# Patient Record
Sex: Female | Born: 1935 | Race: White | Hispanic: No | Marital: Married | Smoking: Never smoker
Health system: Southern US, Community
[De-identification: ages and names within clinical notes are randomized; demographics above are authoritative.]

## PROBLEM LIST (undated history)

## (undated) DIAGNOSIS — I4891 Unspecified atrial fibrillation: Secondary | ICD-10-CM

## (undated) DIAGNOSIS — I509 Heart failure, unspecified: Secondary | ICD-10-CM

## (undated) DIAGNOSIS — I10 Essential (primary) hypertension: Secondary | ICD-10-CM

## (undated) DIAGNOSIS — E785 Hyperlipidemia, unspecified: Secondary | ICD-10-CM

## (undated) HISTORY — PX: KNEE SURGERY: SHX244

## (undated) HISTORY — PX: PACEMAKER INSERTION: SHX728

## (undated) HISTORY — PX: THYROIDECTOMY: SHX17

---

## 2003-11-27 ENCOUNTER — Other Ambulatory Visit: Payer: Self-pay

## 2006-03-06 ENCOUNTER — Ambulatory Visit: Payer: Self-pay | Admitting: Urology

## 2006-09-12 ENCOUNTER — Other Ambulatory Visit: Payer: Self-pay

## 2006-09-19 ENCOUNTER — Inpatient Hospital Stay: Payer: Self-pay | Admitting: Unknown Physician Specialty

## 2006-09-23 ENCOUNTER — Encounter: Payer: Self-pay | Admitting: Internal Medicine

## 2006-10-07 ENCOUNTER — Encounter: Payer: Self-pay | Admitting: Internal Medicine

## 2006-11-07 ENCOUNTER — Encounter: Payer: Self-pay | Admitting: Internal Medicine

## 2007-09-10 ENCOUNTER — Ambulatory Visit: Payer: Self-pay | Admitting: Unknown Physician Specialty

## 2008-03-29 ENCOUNTER — Emergency Department: Payer: Self-pay | Admitting: Emergency Medicine

## 2008-04-25 ENCOUNTER — Emergency Department: Payer: Self-pay | Admitting: Internal Medicine

## 2009-10-19 ENCOUNTER — Ambulatory Visit: Payer: Self-pay | Admitting: Ophthalmology

## 2009-10-19 ENCOUNTER — Ambulatory Visit: Payer: Self-pay | Admitting: Cardiovascular Disease

## 2009-10-26 ENCOUNTER — Ambulatory Visit: Payer: Self-pay | Admitting: Ophthalmology

## 2009-12-08 ENCOUNTER — Ambulatory Visit: Payer: Self-pay | Admitting: Ophthalmology

## 2012-07-27 ENCOUNTER — Emergency Department: Payer: Self-pay | Admitting: Emergency Medicine

## 2012-07-27 LAB — PROTIME-INR: INR: 2

## 2013-11-09 ENCOUNTER — Emergency Department: Payer: Self-pay | Admitting: Emergency Medicine

## 2013-11-09 LAB — COMPREHENSIVE METABOLIC PANEL
ALBUMIN: 3.3 g/dL — AB (ref 3.4–5.0)
ANION GAP: 3 — AB (ref 7–16)
Alkaline Phosphatase: 72 U/L
BILIRUBIN TOTAL: 0.8 mg/dL (ref 0.2–1.0)
BUN: 17 mg/dL (ref 7–18)
CO2: 28 mmol/L (ref 21–32)
Calcium, Total: 9.4 mg/dL (ref 8.5–10.1)
Chloride: 101 mmol/L (ref 98–107)
Creatinine: 0.89 mg/dL (ref 0.60–1.30)
EGFR (Non-African Amer.): 59 — ABNORMAL LOW
Glucose: 90 mg/dL (ref 65–99)
Osmolality: 266 (ref 275–301)
Potassium: 3.6 mmol/L (ref 3.5–5.1)
SGOT(AST): 42 U/L — ABNORMAL HIGH (ref 15–37)
SGPT (ALT): 25 U/L (ref 12–78)
SODIUM: 132 mmol/L — AB (ref 136–145)
Total Protein: 8.4 g/dL — ABNORMAL HIGH (ref 6.4–8.2)

## 2013-11-09 LAB — TROPONIN I
Troponin-I: <0.02 ng/mL
Troponin-I: 0.02 ng/mL

## 2013-11-09 LAB — PROTIME-INR
INR: 2
PROTHROMBIN TIME: 21.8 s — AB (ref 11.5–14.7)

## 2013-11-09 LAB — URINALYSIS, COMPLETE
Bacteria: NONE SEEN
Bilirubin,UR: NEGATIVE
GLUCOSE, UR: NEGATIVE mg/dL (ref 0–75)
Hyaline Cast: 21
KETONE: NEGATIVE
NITRITE: NEGATIVE
Ph: 6 (ref 4.5–8.0)
Protein: NEGATIVE
SPECIFIC GRAVITY: 1.005 (ref 1.003–1.030)
Squamous Epithelial: NONE SEEN

## 2013-11-09 LAB — CBC
HCT: 37.4 % (ref 35.0–47.0)
HGB: 12.5 g/dL (ref 12.0–16.0)
MCH: 30.7 pg (ref 26.0–34.0)
MCHC: 33.5 g/dL (ref 32.0–36.0)
MCV: 92 fL (ref 80–100)
Platelet: 120 10*3/uL — ABNORMAL LOW (ref 150–440)
RBC: 4.08 10*6/uL (ref 3.80–5.20)
RDW: 14.9 % — AB (ref 11.5–14.5)
WBC: 8.5 10*3/uL (ref 3.6–11.0)

## 2013-11-09 LAB — CK TOTAL AND CKMB (NOT AT ARMC)
CK, Total: 107 U/L (ref 21–215)
CK-MB: 2.5 ng/mL (ref 0.5–3.6)

## 2014-03-05 ENCOUNTER — Inpatient Hospital Stay: Payer: Self-pay | Admitting: Specialist

## 2014-03-05 LAB — COMPREHENSIVE METABOLIC PANEL
ANION GAP: 12 (ref 7–16)
AST: 56 U/L — AB (ref 15–37)
Albumin: 3.7 g/dL (ref 3.4–5.0)
Alkaline Phosphatase: 86 U/L
BUN: 20 mg/dL — AB (ref 7–18)
Bilirubin,Total: 0.6 mg/dL (ref 0.2–1.0)
CO2: 22 mmol/L (ref 21–32)
Calcium, Total: 9.4 mg/dL (ref 8.5–10.1)
Chloride: 101 mmol/L (ref 98–107)
Creatinine: 1.17 mg/dL (ref 0.60–1.30)
EGFR (Non-African Amer.): 42 — ABNORMAL LOW
GFR CALC AF AMER: 49 — AB
GLUCOSE: 102 mg/dL — AB (ref 65–99)
OSMOLALITY: 273 (ref 275–301)
Potassium: 3.4 mmol/L — ABNORMAL LOW (ref 3.5–5.1)
SGPT (ALT): 33 U/L (ref 12–78)
Sodium: 135 mmol/L — ABNORMAL LOW (ref 136–145)
Total Protein: 8.6 g/dL — ABNORMAL HIGH (ref 6.4–8.2)

## 2014-03-05 LAB — CBC WITH DIFFERENTIAL/PLATELET
BASOS ABS: 0 10*3/uL (ref 0.0–0.1)
BASOS PCT: 0.2 %
EOS ABS: 0.1 10*3/uL (ref 0.0–0.7)
Eosinophil %: 0.8 %
HCT: 39.1 % (ref 35.0–47.0)
HGB: 13 g/dL (ref 12.0–16.0)
Lymphocyte #: 0.4 10*3/uL — ABNORMAL LOW (ref 1.0–3.6)
Lymphocyte %: 5.8 %
MCH: 30.4 pg (ref 26.0–34.0)
MCHC: 33.3 g/dL (ref 32.0–36.0)
MCV: 92 fL (ref 80–100)
Monocyte #: 0.1 x10 3/mm — ABNORMAL LOW (ref 0.2–0.9)
Monocyte %: 1.5 %
NEUTROS PCT: 91.7 %
Neutrophil #: 6.3 10*3/uL (ref 1.4–6.5)
Platelet: 200 10*3/uL (ref 150–440)
RBC: 4.27 10*6/uL (ref 3.80–5.20)
RDW: 14.4 % (ref 11.5–14.5)
WBC: 6.9 10*3/uL (ref 3.6–11.0)

## 2014-03-05 LAB — URINALYSIS, COMPLETE
Bilirubin,UR: NEGATIVE
GLUCOSE, UR: NEGATIVE mg/dL (ref 0–75)
Ketone: NEGATIVE
NITRITE: POSITIVE
PH: 6 (ref 4.5–8.0)
Protein: 100
RBC,UR: 18 /HPF (ref 0–5)
SPECIFIC GRAVITY: 1.01 (ref 1.003–1.030)
Squamous Epithelial: <1
WBC UR: 38 /HPF (ref 0–5)

## 2014-03-05 LAB — PROTIME-INR
INR: 2.5
Prothrombin Time: 26.7 secs — ABNORMAL HIGH (ref 11.5–14.7)

## 2014-03-06 LAB — BASIC METABOLIC PANEL
ANION GAP: 6 — AB (ref 7–16)
BUN: 19 mg/dL — AB (ref 7–18)
CO2: 22 mmol/L (ref 21–32)
Calcium, Total: 8.8 mg/dL (ref 8.5–10.1)
Chloride: 113 mmol/L — ABNORMAL HIGH (ref 98–107)
Creatinine: 0.84 mg/dL (ref 0.60–1.30)
EGFR (African American): >60
EGFR (Non-African Amer.): >60
GLUCOSE: 94 mg/dL (ref 65–99)
Osmolality: 283 (ref 275–301)
POTASSIUM: 4 mmol/L (ref 3.5–5.1)
Sodium: 141 mmol/L (ref 136–145)

## 2014-03-06 LAB — CBC WITH DIFFERENTIAL/PLATELET
BASOS PCT: 0.8 %
Basophil #: 0.1 10*3/uL (ref 0.0–0.1)
EOS ABS: 0.4 10*3/uL (ref 0.0–0.7)
EOS PCT: 3.6 %
HCT: 31.5 % — ABNORMAL LOW (ref 35.0–47.0)
HGB: 10.5 g/dL — ABNORMAL LOW (ref 12.0–16.0)
LYMPHS PCT: 11.1 %
Lymphocyte #: 1.1 10*3/uL (ref 1.0–3.6)
MCH: 30.9 pg (ref 26.0–34.0)
MCHC: 33.4 g/dL (ref 32.0–36.0)
MCV: 93 fL (ref 80–100)
MONO ABS: 1.1 x10 3/mm — AB (ref 0.2–0.9)
Monocyte %: 11.1 %
NEUTROS ABS: 7.4 10*3/uL — AB (ref 1.4–6.5)
Neutrophil %: 73.4 %
Platelet: 127 10*3/uL — ABNORMAL LOW (ref 150–440)
RBC: 3.4 10*6/uL — ABNORMAL LOW (ref 3.80–5.20)
RDW: 14.8 % — AB (ref 11.5–14.5)
WBC: 10.1 10*3/uL (ref 3.6–11.0)

## 2014-03-06 LAB — PROTIME-INR
INR: 2.3
Prothrombin Time: 24.8 secs — ABNORMAL HIGH (ref 11.5–14.7)

## 2014-03-07 LAB — BASIC METABOLIC PANEL
Anion Gap: 8 (ref 7–16)
BUN: 14 mg/dL (ref 7–18)
CO2: 24 mmol/L (ref 21–32)
CREATININE: 0.76 mg/dL (ref 0.60–1.30)
Calcium, Total: 9.2 mg/dL (ref 8.5–10.1)
Chloride: 106 mmol/L (ref 98–107)
EGFR (Non-African Amer.): >60
Glucose: 99 mg/dL (ref 65–99)
Osmolality: 276 (ref 275–301)
POTASSIUM: 3.8 mmol/L (ref 3.5–5.1)
SODIUM: 138 mmol/L (ref 136–145)

## 2014-03-07 LAB — CBC WITH DIFFERENTIAL/PLATELET
BASOS PCT: 0.5 %
Basophil #: 0 10*3/uL (ref 0.0–0.1)
Eosinophil #: 0.4 10*3/uL (ref 0.0–0.7)
Eosinophil %: 4.3 %
HCT: 37.3 % (ref 35.0–47.0)
HGB: 12.4 g/dL (ref 12.0–16.0)
LYMPHS ABS: 1.3 10*3/uL (ref 1.0–3.6)
Lymphocyte %: 13.2 %
MCH: 30.4 pg (ref 26.0–34.0)
MCHC: 33.3 g/dL (ref 32.0–36.0)
MCV: 91 fL (ref 80–100)
MONO ABS: 1.1 x10 3/mm — AB (ref 0.2–0.9)
Monocyte %: 11.4 %
NEUTROS ABS: 6.9 10*3/uL — AB (ref 1.4–6.5)
Neutrophil %: 70.6 %
PLATELETS: 163 10*3/uL (ref 150–440)
RBC: 4.09 10*6/uL (ref 3.80–5.20)
RDW: 14.6 % — AB (ref 11.5–14.5)
WBC: 9.7 10*3/uL (ref 3.6–11.0)

## 2014-03-07 LAB — PROTIME-INR
INR: 1.9
Prothrombin Time: 21.3 secs — ABNORMAL HIGH (ref 11.5–14.7)

## 2014-03-08 LAB — CBC WITH DIFFERENTIAL/PLATELET
BASOS ABS: 0.1 10*3/uL (ref 0.0–0.1)
BASOS PCT: 0.7 %
EOS ABS: 0.4 10*3/uL (ref 0.0–0.7)
EOS PCT: 5.2 %
HCT: 39.3 % (ref 35.0–47.0)
HGB: 13 g/dL (ref 12.0–16.0)
Lymphocyte #: 1.5 10*3/uL (ref 1.0–3.6)
Lymphocyte %: 19.4 %
MCH: 30.5 pg (ref 26.0–34.0)
MCHC: 33.2 g/dL (ref 32.0–36.0)
MCV: 92 fL (ref 80–100)
MONO ABS: 1.1 x10 3/mm — AB (ref 0.2–0.9)
MONOS PCT: 14.3 %
NEUTROS ABS: 4.7 10*3/uL (ref 1.4–6.5)
Neutrophil %: 60.4 %
Platelet: 142 10*3/uL — ABNORMAL LOW (ref 150–440)
RBC: 4.27 10*6/uL (ref 3.80–5.20)
RDW: 14.2 % (ref 11.5–14.5)
WBC: 7.7 10*3/uL (ref 3.6–11.0)

## 2014-03-08 LAB — BASIC METABOLIC PANEL
Anion Gap: 7 (ref 7–16)
BUN: 18 mg/dL (ref 7–18)
Calcium, Total: 9.5 mg/dL (ref 8.5–10.1)
Chloride: 106 mmol/L (ref 98–107)
Co2: 23 mmol/L (ref 21–32)
Creatinine: 0.78 mg/dL (ref 0.60–1.30)
EGFR (African American): >60
EGFR (Non-African Amer.): >60
Glucose: 87 mg/dL (ref 65–99)
Osmolality: 273 (ref 275–301)
Potassium: 4 mmol/L (ref 3.5–5.1)
Sodium: 136 mmol/L (ref 136–145)

## 2014-03-08 LAB — PROTIME-INR
INR: 2
Prothrombin Time: 21.9 secs — ABNORMAL HIGH (ref 11.5–14.7)

## 2014-03-10 LAB — CULTURE, BLOOD (SINGLE)

## 2014-04-26 ENCOUNTER — Inpatient Hospital Stay: Payer: Self-pay | Admitting: Internal Medicine

## 2014-04-26 LAB — BASIC METABOLIC PANEL
ANION GAP: 7 (ref 7–16)
BUN: 13 mg/dL (ref 7–18)
CALCIUM: 9.5 mg/dL (ref 8.5–10.1)
Chloride: 96 mmol/L — ABNORMAL LOW (ref 98–107)
Co2: 25 mmol/L (ref 21–32)
Creatinine: 0.92 mg/dL (ref 0.60–1.30)
EGFR (Non-African Amer.): 57 — ABNORMAL LOW
Glucose: 79 mg/dL (ref 65–99)
Osmolality: 256 (ref 275–301)
POTASSIUM: 3.6 mmol/L (ref 3.5–5.1)
Sodium: 128 mmol/L — ABNORMAL LOW (ref 136–145)

## 2014-04-26 LAB — URINALYSIS, COMPLETE
Bilirubin,UR: NEGATIVE
GLUCOSE, UR: NEGATIVE mg/dL (ref 0–75)
KETONE: NEGATIVE
Nitrite: NEGATIVE
PROTEIN: NEGATIVE
Ph: 8 (ref 4.5–8.0)
RBC,UR: 8 /HPF (ref 0–5)
Specific Gravity: 1.006 (ref 1.003–1.030)
Squamous Epithelial: <1

## 2014-04-26 LAB — CBC
HCT: 38.1 % (ref 35.0–47.0)
HGB: 12.5 g/dL (ref 12.0–16.0)
MCH: 30.2 pg (ref 26.0–34.0)
MCHC: 32.8 g/dL (ref 32.0–36.0)
MCV: 92 fL (ref 80–100)
RBC: 4.14 10*6/uL (ref 3.80–5.20)
RDW: 14.2 % (ref 11.5–14.5)
WBC: 8.2 10*3/uL (ref 3.6–11.0)

## 2014-04-26 LAB — CK TOTAL AND CKMB (NOT AT ARMC)
CK, TOTAL: 181 U/L
CK, Total: 121 U/L
CK, Total: 121 U/L
CK-MB: 2.2 ng/mL (ref 0.5–3.6)
CK-MB: 1.6 ng/mL (ref 0.5–3.6)
CK-MB: 1.6 ng/mL (ref 0.5–3.6)

## 2014-04-26 LAB — PRO B NATRIURETIC PEPTIDE: B-Type Natriuretic Peptide: 1027 pg/mL — ABNORMAL HIGH (ref 0–450)

## 2014-04-26 LAB — PROTIME-INR
INR: 1.6
PROTHROMBIN TIME: 18.8 s — AB (ref 11.5–14.7)

## 2014-04-26 LAB — TROPONIN I
Troponin-I: <0.02 ng/mL
Troponin-I: <0.02 ng/mL

## 2014-04-27 LAB — CBC WITH DIFFERENTIAL/PLATELET
BASOS PCT: 0.6 %
Basophil #: 0 10*3/uL (ref 0.0–0.1)
EOS PCT: 0.8 %
Eosinophil #: 0.1 10*3/uL (ref 0.0–0.7)
HCT: 34.3 % — AB (ref 35.0–47.0)
HGB: 11.7 g/dL — ABNORMAL LOW (ref 12.0–16.0)
LYMPHS ABS: 1.2 10*3/uL (ref 1.0–3.6)
Lymphocyte %: 17.2 %
MCH: 31.3 pg (ref 26.0–34.0)
MCHC: 34.1 g/dL (ref 32.0–36.0)
MCV: 92 fL (ref 80–100)
Monocyte #: 0.7 x10 3/mm (ref 0.2–0.9)
Monocyte %: 10.3 %
Neutrophil #: 4.8 10*3/uL (ref 1.4–6.5)
Neutrophil %: 71.1 %
Platelet: 177 10*3/uL (ref 150–440)
RBC: 3.73 10*6/uL — ABNORMAL LOW (ref 3.80–5.20)
RDW: 14.5 % (ref 11.5–14.5)
WBC: 6.8 10*3/uL (ref 3.6–11.0)

## 2014-04-27 LAB — BASIC METABOLIC PANEL
ANION GAP: 10 (ref 7–16)
BUN: 10 mg/dL (ref 7–18)
CALCIUM: 8.8 mg/dL (ref 8.5–10.1)
CHLORIDE: 100 mmol/L (ref 98–107)
CO2: 24 mmol/L (ref 21–32)
Creatinine: 0.9 mg/dL (ref 0.60–1.30)
EGFR (Non-African Amer.): 58 — ABNORMAL LOW
Glucose: 73 mg/dL (ref 65–99)
OSMOLALITY: 266 (ref 275–301)
POTASSIUM: 3.4 mmol/L — AB (ref 3.5–5.1)
SODIUM: 134 mmol/L — AB (ref 136–145)

## 2014-04-27 LAB — PROTIME-INR
INR: 1.9
Prothrombin Time: 21.3 secs — ABNORMAL HIGH (ref 11.5–14.7)

## 2014-04-28 LAB — BASIC METABOLIC PANEL
ANION GAP: 9 (ref 7–16)
BUN: 18 mg/dL (ref 7–18)
CALCIUM: 8.8 mg/dL (ref 8.5–10.1)
CHLORIDE: 99 mmol/L (ref 98–107)
Co2: 27 mmol/L (ref 21–32)
Creatinine: 1.15 mg/dL (ref 0.60–1.30)
EGFR (Non-African Amer.): 43 — ABNORMAL LOW
GFR CALC AF AMER: 50 — AB
GLUCOSE: 92 mg/dL (ref 65–99)
OSMOLALITY: 272 (ref 275–301)
Potassium: 3.6 mmol/L (ref 3.5–5.1)
SODIUM: 135 mmol/L — AB (ref 136–145)

## 2014-04-28 LAB — URINE CULTURE

## 2014-04-28 LAB — PROTIME-INR
INR: 2.1
PROTHROMBIN TIME: 22.9 s — AB (ref 11.5–14.7)

## 2014-04-29 LAB — CBC WITH DIFFERENTIAL/PLATELET
BASOS ABS: 0 10*3/uL (ref 0.0–0.1)
Basophil %: 0.6 %
EOS ABS: 0.2 10*3/uL (ref 0.0–0.7)
Eosinophil %: 4.1 %
HCT: 37.9 % (ref 35.0–47.0)
HGB: 12.9 g/dL (ref 12.0–16.0)
LYMPHS PCT: 22.6 %
Lymphocyte #: 1.3 10*3/uL (ref 1.0–3.6)
MCH: 31.3 pg (ref 26.0–34.0)
MCHC: 34.1 g/dL (ref 32.0–36.0)
MCV: 92 fL (ref 80–100)
Monocyte #: 0.6 x10 3/mm (ref 0.2–0.9)
Monocyte %: 10.5 %
Neutrophil #: 3.6 10*3/uL (ref 1.4–6.5)
Neutrophil %: 62.2 %
Platelet: 103 10*3/uL — ABNORMAL LOW (ref 150–440)
RBC: 4.11 10*6/uL (ref 3.80–5.20)
RDW: 14.7 % — AB (ref 11.5–14.5)
WBC: 5.8 10*3/uL (ref 3.6–11.0)

## 2014-04-29 LAB — BASIC METABOLIC PANEL
Anion Gap: 4 — ABNORMAL LOW (ref 7–16)
BUN: 22 mg/dL — AB (ref 7–18)
Calcium, Total: 9.3 mg/dL (ref 8.5–10.1)
Chloride: 101 mmol/L (ref 98–107)
Co2: 27 mmol/L (ref 21–32)
Creatinine: 1.27 mg/dL (ref 0.60–1.30)
EGFR (Non-African Amer.): 38 — ABNORMAL LOW
GFR CALC AF AMER: 45 — AB
Glucose: 85 mg/dL (ref 65–99)
OSMOLALITY: 267 (ref 275–301)
Potassium: 4 mmol/L (ref 3.5–5.1)
Sodium: 132 mmol/L — ABNORMAL LOW (ref 136–145)

## 2014-04-29 LAB — PROTIME-INR
INR: 2.6
Prothrombin Time: 26.8 secs — ABNORMAL HIGH (ref 11.5–14.7)

## 2014-05-01 LAB — CULTURE, BLOOD (SINGLE)

## 2015-02-28 NOTE — Consult Note (Signed)
Brief Consult Note: Diagnosis: Pt with nonischemic cm with BIV pacemaker folowed by Sherron [facility]K [facility] at [facility] admitted with progressive sob and noted to be in mild chf.   Patient was seen by consultant.   Recommend further assessment or treatment.   Comments: 81 yo female with nonischemic cm with BiV pacemaker [facility] by Sedalia MutaKevin [facility], history of afib treated with rate conrol and anticoagulated with warfarin, and metoprolol. On simvastatin for lipids. Noted gradual increase in weight at home. Took an extra 1/2 lasix at home without improvemnt. CXR revaled mild chf. Now improved and at baseline. Would contineu current meds and ambulate. Consider d/c if stable. Has follow up appt with Dr. Sedalia MutaKevin Jackson next week.  Electronic Signatures: Dalia HeadingFath, Kenneth A (MD)  (Signed 21-Jun-15 12:56)  Authored: Brief Consult Note   Last Updated: 21-Jun-15 12:56 by Dalia HeadingFath, Kenneth A (MD)

## 2015-02-28 NOTE — H&P (Signed)
PATIENT NAME:  Courtney Pruitt, Courtney Pruitt MR#:  [id number] DATE OF BIRTH:  [birth year]  DATE OF ADMISSION:  03/05/2014  REFERRING PHYSICIAN: Dr. Ladona Ridgelaylor  [facility]   CHIEF COMPLAINT: Fevers, chills.   HISTORY OF PRESENT ILLNESS: An 81 year old Caucasian female with medical history of atrial fibrillation, hypertension, hypothyroidism, presenting with fevers, chills. She describes two day duration of fevers and chills. Saw her PCP for above symptoms, diagnosed with presumptive sinusitis, given antibiotic coverage with doxycycline and Keflex. Symptoms, however, persisted. Upon further questioning, she does note having increased urinary frequency. Denies any frank dysuria and she denies any further symptomatology other than generalized weakness and fatigue. In the Emergency Department she was noted to be tachycardic, with heart rates up to the 140s, as well as febrile with temperature 104.3 on arrival. Emergency Department course: Currently receiving IV Cardizem to achieve better rate control and initiated broad-spectrum antibiotic coverage after cultures obtained. Currently, she is complaining only of general fatigue and chills.   REVIEW OF SYSTEMS:  CONSTITUTIONAL: Positive for fatigue, weakness, chills.  EYES: Denies blurred vision, double vision, eye pain.  EARS, NOSE, THROAT: Denies tinnitus, ear pain, hearing loss.  RESPIRATORY: Denies cough, wheeze, shortness of breath.  CARDIOVASCULAR: Denies chest pain, palpitations, edema.  GASTROINTESTINAL: Denies nausea, vomiting, diarrhea, abdominal pain.  GENITOURINARY: Denies dysuria, hematuria.  ENDOCRINE: Denies nocturia or thyroid problems.  HEMATOLOGIC AND LYMPHATIC: Denies easy bruising, bleeding.  SKIN: Denies rash or lesions.  MUSCULOSKELETAL: Denies pain in neck, back, shoulders, knees, hips, or arthritic symptoms. NEUROLOGIC: Denies paralysis, paresthesias.  PSYCHIATRIC: Denies anxiety or depressive symptoms.  Otherwise,  full review of systems performed by me is negative.   PAST MEDICAL HISTORY: Congestive heart failure with unknown ejection fraction, atrial fibrillation, status post permanent pacemaker insertion, hypertension, hypothyroidism.   SOCIAL HISTORY: Denies any alcohol, tobacco or drug usage.   FAMILY HISTORY: Positive for coronary artery disease as well as lung cancer.   ALLERGIES: ACE INHIBITORS AMIODARONE, CINOBAC, CIPRO, CODEINE, FLUORESCEIN IV CONTRAST DYE, MACROBID, PENICILLIN, QUINIDINE, SEPTRA, SULFA DRUGS.   HOME MEDICATIONS: Include Benicar 40 mg p.o. daily, calcium carbonate 250 mg 2 tablets daily, warfarin 2.5 mg daily, Zyrtec 10 mg p.o. daily, simvastatin 10 mg p.o. at bedtime, doxycycline po 100 mg p.o. b.i.d., Lopressor 25 mg p.o. b.i.d., Norvasc 2.5 mg p.o. daily, Keflex 250 mg p.o. daily, Lasix 40 mg p.o. daily, Metamucil once daily at bedtime, potassium 10 mEq p.o. daily.   PHYSICAL EXAMINATION: VITAL SIGNS: Temperature 104.5, heart rate 138, respirations 20, blood pressure 168/73 on arrival, repeat 117/58, saturating 90% on room air, currently saturating 94% on supplementation with O2, 5 liters. Weight 60.8 kg, BMI 22.3.  GENERAL: Chronically ill-appearing Caucasian female in minimal distress given fever and heart rate.  HEAD: Normocephalic, atraumatic.  EYES: Pupils equal, round, and reactive light. Extraocular muscles intact. No scleral icterus. Eyes sunken.  MOUTH: Dry mucosal membrane. Dentition is intact. No abscess noted.  EARS, NOSE, AND THROAT: Clear without exudates. No external lesions.  NECK: Supple. No thyromegaly. No nodules. No JVD.  PULMONARY: Scant coarse breath sounds, right lower lobe, with good air entry bilaterally. No use of accessory muscles. Good respiratory effort.  CHEST: Nontender to palpation.  CARDIOVASCULAR: S1 and S2. Irregular rate, irregular rhythm. No murmurs, rubs, or gallops. No edema. Pedal pulses 2+ bilaterally.  GASTROINTESTINAL: Soft,  nontender, nondistended. No masses. Positive bowel sounds. No hepatosplenomegaly.  MUSCULOSKELETAL: No swelling from the edema. Range of motion full in all extremities.  NEUROLOGIC: Cranial  nerves II through XII intact. No gross focal neurological deficits. Sensation intact, reflexes intact.  SKIN: No ulcerations, lesions, rash, cyanosis. Skin warm, dry. Turgor intact.  PSYCHIATRIC: Mood and affect within normal limits. The patient is awake, alert, oriented x3. Insight and judgment intact.   LABORATORY DATA: EKG performed, which appears to be sinus tachycardia, heart rate in the 130s, with V pacing, however, currently on telemetry, she was in atrial fibrillation, heart rate in the 120s. Chest x-ray performed: Coarse infrahilar lung markings in the right, suggestive of lower lobe atelectasis or pneumonia. Remainder of laboratory data: Sodium 135, potassium 3.4, chloride 101, bicarbonate 22, BUN 20, creatinine 1.17, glucose 102. LFTs: Protein 8.6, AST 56. Remainder within normal limits. WBC of 6.9, hemoglobin 13, platelets of 200. INR of 2.5. Urinalysis: WBCs 38, RBCs 18, leukocyte esterase 1+, nitrite positive, epithelial cells less than 1. Lactic acid of 3.9. ABG performed on nasal cannula: pH 7.4, pCO2 33, O2 of 58 and bicarbonate of 20.6.   ASSESSMENT AND PLAN: An 81 year old Caucasian female with history of atrial fibrillation, hypertension, hypothyroidism, presenting with fevers, chills.  1.  Sepsis, meeting septic criteria by heart rate, temperature, and initial respiratory rate. Etiology likely urinary source, less likely pulmonary source, though she was hypoxemic on arrival. Pancultures, including blood, urine and sputum. Add broad-spectrum antibiotic coverage with vancomycin and cefepime. IV fluid hydration to keep mean arterial pressure greater than 65. 2.  Atrial fibrillation with rapid ventricular response, heart rate in the 130s to 140s. We will give IV Cardizem 10 mg once now. After  administration of Cardizem, heart rate decreased to the 80s. Goal heart rate will be persistently less than 120. I suspect this new poor control is due to sepsis. We will continue warfarin for anticoagulation.  3.  Lactic acidosis. IV fluid hydration to keep mean arterial pressure greater than 65.  4.  Hypokalemia. Replace potassium to goal of 4 to 5.  5.  Deep venous thrombosis prophylaxis, therapeutic on warfarin.   CODE STATUS: The patient is full code. I discussed with the patient and family at bedside.   TIME SPENT: 45 minutes.   ____________________________ Cletis Athens. Abriella Filkins, MD dkh:cg D: 03/05/2014 03:46:57 ET T: 03/05/2014 04:21:31 ET JOB#: [id number]  cc: Cletis Athens. Meekah Math, MD, <Dictator> Ed Mandich Synetta Shadow MD ELECTRONICALLY SIGNED 03/12/2014 20:26

## 2015-02-28 NOTE — H&P (Signed)
PATIENT NAME:  Courtney Pruitt, Courtney Pruitt MR#:  [id number] DATE OF BIRTH:  10/18/1926  DATE OF ADMISSION:  04/26/2014  REFERRING PHYSICIAN: Dr. Lenard Lance.   FAMILY PHYSICIAN: Dr. Marcello Fennel.   REASON FOR ADMISSION: Congestive heart failure.   HISTORY OF PRESENT ILLNESS: The patient is an 81 year old female with a history of chronic atrial fibrillation and sick sinus syndrome, status post pacemaker implant. Has chronic diastolic congestive heart failure. Also has a history of recurrent UTIs and sepsis. He is followed by urology in [place] and cardiology at [facility]. Presents now with a 15 pound weight gain with progressive shortness of breath and generalized weakness. In the Emergency Room, the patient was noted to be in pulmonary edema and was also noted to have a recurrent urinary tract infection. She is now admitted for further evaluation.   PAST MEDICAL HISTORY:  1.  Chronic atrial fibrillation.  2.  Sick sinus syndrome, status post pacemaker implant.  3.  Chronic congestive heart failure.  4.  History of recurrent urinary tract infections.  5.  History of sepsis. 6.  Benign hypertension.  7.  Hypothyroidism.   MEDICATIONS ON ADMISSION:  1.  Zyrtec 10 mg p.o. daily.  2.  Coumadin 2.5 mg p.o. q. Monday-Friday with 5 mg q. Saturday and Sunday.  3.  Vitamin C 500 mg p.o. daily.  4.  Zocor 10 mg p.o. daily.  5.  Lopressor 25 mg p.o. b.i.d.  6.  Klor-Con 10 mEq p.o. daily.  7.  Lasix 40 mg p.o. daily. 8.  Benicar 40 mg p.o. daily. 9.  Norvasc 2.5 mg p.o. daily.   ALLERGIES: CODEINE, PENICILLIN, SULFA, MACROBID, CIPRO, ACE INHIBITORS, QUINIDINE, AMIODARONE, IVP DYE, AND MACROBID.   SOCIAL HISTORY: Negative for alcohol or tobacco abuse.   FAMILY HISTORY: Positive coronary artery disease, hypertension, stroke. Negative for breast or colon cancer.   REVIEW OF SYSTEMS:  CONSTITUTIONAL: No fever. Has had weight gain.  EYES: No blurred or double vision. No glaucoma.  ENT: No tinnitus or hearing loss. No  nasal discharge or bleeding. No difficulty swallowing.  RESPIRATORY: The patient denies cough or wheezing. No hemoptysis.  CARDIOVASCULAR: No chest pain or palpitations. No syncope.  GASTROINTESTINAL: Some nausea, but no vomiting or diarrhea. No abdominal pain. No change in bowel habits.  GENITOURINARY: No dysuria or hematuria. No incontinence.  ENDOCRINE: No polyuria or polydipsia. No heat or cold intolerance.  HEMATOLOGIC: The patient denies anemia, easy bruising, or bleeding.  LYMPHATIC: No swollen glands.  MUSCULOSKELETAL: The patient denies pain in her back, back, shoulders, knees, or hips. No gout.  NEUROLOGIC: No numbness or migraines. Denies stroke or seizures.  PSYCHOLOGICAL: The patient denies anxiety, insomnia, or depression.   PHYSICAL EXAMINATION:  GENERAL: The patient is chronically ill-appearing, but in no acute distress.  VITAL SIGNS: Remarkable for a blood pressure of 138/73, heart rate 83, respiratory rate of 18, temperature of 100.1, and saturations 95% on room air.  HEENT: Normocephalic, atraumatic. Pupils equally round and reactive to light and accommodation. Extraocular movements are intact. Sclerae nonicteric. Conjunctivae are clear. Oropharynx is clear.  NECK: Supple without JVD. No adenopathy or thyromegaly.  LUNGS: Reveal basilar rales without wheezes or rhonchi. No dullness. Respiratory effort is normal.  CARDIAC: Regular rate and rhythm with normal S1 and S2. No significant rubs or gallops. PMI is nondisplaced. Chest wall is nontender.  ABDOMEN: Soft and nontender with normoactive bowel sounds. No organomegaly or masses were appreciated. No hernias or bruits were noted.  EXTREMITIES: Revealed 1+ edema with  stasis changes. Pulses were 2+ bilaterally. No clubbing or cyanosis.  SKIN: Warm and dry without rash or lesions.  NEUROLOGIC: Cranial nerves II-XII grossly intact. Deep tendon reflexes were symmetric. Motor and sensory exam is nonfocal.  PSYCHIATRIC: Revealed a  patient who is alert and oriented to person, place, and time. She is cooperative and used good judgment.   LABORATORY DATA: EKG revealed a paced rhythm. Chest x-ray revealed congestive heart failure. No infiltrates. Her BNP was 1027. Glucose 79 with a BUN of 13, creatinine of 0.92 and a sodium of 128, potassium 3.6. Troponin less than 0.02. White count was 8.2 with a hemoglobin of 12.5. Pro time was 18.8 with an INR of 1.6. Urinalysis revealed trace bacteria with 41 WBCs per high-power field and 2+ leukocyte esterase.   ASSESSMENT:  1.  Acute on chronic diastolic congestive heart failure.  2.  Sick sinus syndrome, status post pacemaker implant.  3.  Chronic atrial fibrillation.  4.  Recurrent urinary tract infections.  5.  Generalized weakness and fatigue.  6.  Failure to thrive.   PLAN: The patient will be admitted to telemetry as a full code. She will be maintained on IV Lasix for diuresis and IV antibiotics for her urinary tract infection. Cultures have been sent. Will consult urology because of her recurrent UTIs and continue Invanz for now. We will consult cardiology because of her congestive heart failure. Will check an echocardiogram and follow serial cardiac enzymes. Switch to oral diuretics once she is stabilized. Wean oxygen as tolerated. Follow up routine labs in the morning. Further treatment and evaluation will depend upon the patient's progress.   TOTAL TIME SPENT ON THIS PATIENT: 50 minutes.     ____________________________ Duane LopeJeffrey D. Judithann SheenSparks, MD jds:ts D: 04/26/2014 16:31:57 ET T: 04/26/2014 16:59:37 ET JOB#: [id number]  cc: Duane LopeJeffrey D. Judithann SheenSparks, MD, <Dictator> Barbette ReichmannVishwanath Hande, MD JEFFREY Rodena Medin SPARKS MD ELECTRONICALLY SIGNED 04/26/2014 18:23

## 2015-02-28 NOTE — Discharge Summary (Signed)
PATIENT NAME:  Courtney Pruitt, Courtney Pruitt MR#:  [id number] DATE OF BIRTH:  1926/06/24  DATE OF ADMISSION:  04/26/2014 DATE OF DISCHARGE:  04/29/2014  DIAGNOSES AT TIME OF DISCHARGE:  1. Acute and chronic diastolic congestive heart failure.  2. Urinary tract infection with Escherichia coli.  3. Sick sinus syndrome, status post pacemaker.  4. Chronic atrial fibrillation.  5. Generalized weakness.  6. Failure to thrive with malnutrition.   CHIEF COMPLAINT: Shortness of breath, weight gain, generalized weakness.   HISTORY OF PRESENT ILLNESS: Courtney Pruitt is an 81 year old female with a history of sick sinus syndrome, chronic Afib status post pacemaker, chronic diastolic congestive heart failure, recurrent UTIs and sepsis who is normally being followed up at [facility] and also cardiology at [facility] Presented to the ED complaining of weight gain associated with progressive shortness of breath and generalized weakness. She was noted to be in pulmonary edema and also noted to have a urinary tract infection.   PAST MEDICAL HISTORY: Significant for chronic Afib, sick sinus syndrome, congestive heart failure, history of recurrent UTIs, sepsis, benign hypertension, hypothyroidism.   Please see H and P for other details.   HOSPITAL COURSE: The patient was admitted to [facility] and started on IV Lasix. She was also started on IV antibiotics for a urinary tract infection. Urine cultures grew more than 100,000 CFU/mL of E. coli that was sensitive to ceftriaxone. Blood cultures were negative. Her antibiotics were switched to ceftriaxone, and she was subsequently started on p.o. Ceftin. Chest x-ray showed evidence of resolution of previously noted pulmonary edema with small bilateral pleural effusions, probably passive subsegmental atelectasis in lower lobes of the lungs and evidence of atherosclerosis. Other labs: Hemoglobin 11.7 and improved to 12.9. BUN was 10, creatinine 0.9, sodium 134, potassium 3.4, chloride 100, CO2 24.  EGFR 58. PT was 26.8 with an INR of 2.6.  The patient gradually improved, and she was stable at the time of discharge and discharged home on the following medications: Simvastatin 10 mg once a day, Benicar 40 mg once a day, amlodipine 2.5 mg once a day, Klor-Con 10 mEq once a day, furosemide 40 mg once a day, metoprolol tartrate 25 mg b.i.d., warfarin 2.5 mg 1 tablet in the evening Monday through Friday and 5 mg on Saturday and Sunday, vitamin C 500 mg once a day, Ensure 250 mL once a day and Ceftin 250 mg b.i.d. for 10 more days.   DISCHARGE INSTRUCTIONS: The patient was advised a low-sodium diet and follow with me, Dr. Ginette Pitman, in 1 to 2 weeks' time. Also advised to keep her followup appointment with urologist and cardiologist as scheduled.   TOTAL TIME SPENT ON DISCHARGING PATIENT: 35 minutes.   ____________________________ Tracie Harrier, MD vh:gb D: 04/30/2014 12:33:34 ET T: 05/01/2014 00:09:38 ET JOB#: [id number]  cc: Tracie Harrier, MD, <Dictator> Tracie Harrier MD ELECTRONICALLY SIGNED 05/07/2014 18:11

## 2015-02-28 NOTE — Discharge Summary (Signed)
PATIENT NAME:  Courtney Pruitt, Courtney Pruitt DATE OF BIRTH:  12/06/1925  DATE OF ADMISSION:  03/05/2014 DATE OF DISCHARGE:  03/08/2014   DISCHARGE DIAGNOSES:  1. Urinary tract infection with sepsis.  2. Bronchitis.  3. Chronic congestive heart failure.  4. Chronic atrial fibrillation.  5. Hypertension.  6. Hypothyroidism.   DISCHARGE MEDICATIONS:  1. Benicar 40 mg daily. 2. Ceftin 500 mg b.i.d. for 1 week.  3. Nystatin 5 mL swish and swallow q.i.d. for 1 week.  4. Calcium carbonate 500 mg daily.  5. Warfarin 2.5 mg daily.  6. Simvastatin 10 mg at bedtime.  7. Lopressor 25 mg b.i.d. 8. Norvasc 2.5 mg daily.  9. Lasix 40 mg daily.  10. Potassium 10 mEq daily.   REASON FOR ADMISSION: An 81 year old female presents with fever and chills. Please see H and P for HPI, past medical history and physical exam.   HOSPITAL COURSE: The patient was admitted, placed on IV Rocephin. Cultures grew E. coli resistant to Cipro. She responded well to the IV Rocephin and will be going home on p.o. Ceftin. She feels and her family feels like she is stable to go home. She will be getting nystatin for some oral thrush and follow up with Dr. Marcello FennelHande in 2 weeks.   ____________________________ Danella PentonMark F. Sammuel Blick, MD mfm:lb D: 03/08/2014 09:36:17 ET T: 03/08/2014 10:23:41 ET JOB#: [id number]  cc: Danella PentonMark F. Kainon Varady, MD, <Dictator> Danella PentonMARK F Devonta Blanford MD ELECTRONICALLY SIGNED 03/08/2014 12:08

## 2015-04-17 ENCOUNTER — Emergency Department: Payer: Medicare Other

## 2015-04-17 ENCOUNTER — Encounter: Payer: Self-pay | Admitting: Emergency Medicine

## 2015-04-17 ENCOUNTER — Other Ambulatory Visit: Payer: Self-pay

## 2015-04-17 ENCOUNTER — Emergency Department
Admission: EM | Admit: 2015-04-17 | Discharge: 2015-04-17 | Disposition: A | Discharge status: Home / Self Care | Payer: Medicare Other | Attending: Emergency Medicine | Admitting: Emergency Medicine

## 2015-04-17 DIAGNOSIS — I10 Essential (primary) hypertension: Secondary | ICD-10-CM | POA: Insufficient documentation

## 2015-04-17 DIAGNOSIS — R05 Cough: Secondary | ICD-10-CM | POA: Diagnosis not present

## 2015-04-17 DIAGNOSIS — R0602 Shortness of breath: Secondary | ICD-10-CM | POA: Diagnosis present

## 2015-04-17 DIAGNOSIS — R0600 Dyspnea, unspecified: Secondary | ICD-10-CM | POA: Diagnosis not present

## 2015-04-17 DIAGNOSIS — R059 Cough, unspecified: Secondary | ICD-10-CM

## 2015-04-17 HISTORY — DX: Hyperlipidemia, unspecified: E78.5

## 2015-04-17 HISTORY — DX: Unspecified atrial fibrillation: I48.91

## 2015-04-17 HISTORY — DX: Essential (primary) hypertension: I10

## 2015-04-17 HISTORY — DX: Heart failure, unspecified: I50.9

## 2015-04-17 LAB — CBC
HCT: 33.7 % — ABNORMAL LOW (ref 35.0–47.0)
HEMOGLOBIN: 11.4 g/dL — AB (ref 12.0–16.0)
MCH: 31 pg (ref 26.0–34.0)
MCHC: 33.9 g/dL (ref 32.0–36.0)
MCV: 91.5 fL (ref 80.0–100.0)
Platelets: 211 10*3/uL (ref 150–440)
RBC: 3.69 MIL/uL — ABNORMAL LOW (ref 3.80–5.20)
RDW: 14.4 % (ref 11.5–14.5)
WBC: 6.7 10*3/uL (ref 3.6–11.0)

## 2015-04-17 LAB — BASIC METABOLIC PANEL
ANION GAP: 9 (ref 5–15)
BUN: 21 mg/dL — ABNORMAL HIGH (ref 6–20)
CO2: 25 mmol/L (ref 22–32)
Calcium: 8.9 mg/dL (ref 8.9–10.3)
Chloride: 93 mmol/L — ABNORMAL LOW (ref 101–111)
Creatinine, Ser: 0.92 mg/dL (ref 0.44–1.00)
GFR calc Af Amer: >60 mL/min (ref >60)
GFR, EST NON AFRICAN AMERICAN: 55 mL/min — AB (ref >60)
Glucose, Bld: 78 mg/dL (ref 65–99)
Potassium: 3.8 mmol/L (ref 3.5–5.1)
SODIUM: 127 mmol/L — AB (ref 135–145)

## 2015-04-17 LAB — TROPONIN I

## 2015-04-17 LAB — BRAIN NATRIURETIC PEPTIDE: B Natriuretic Peptide: 365 pg/mL — ABNORMAL HIGH (ref 0.0–100.0)

## 2015-04-17 MED ORDER — ALBUTEROL SULFATE HFA 108 (90 BASE) MCG/ACT IN AERS: 2.0000 | INHALATION_SPRAY | Freq: Four times a day (QID) | RESPIRATORY_TRACT | Status: AC | PRN

## 2015-04-17 MED ORDER — AZITHROMYCIN 250 MG PO TABS: ORAL_TABLET | ORAL | Status: DC

## 2015-04-17 MED ORDER — PREDNISONE 50 MG PO TABS: ORAL_TABLET | ORAL | Status: DC

## 2015-04-17 MED ORDER — HYDROCODONE-ACETAMINOPHEN 5-325 MG PO TABS
ORAL_TABLET | ORAL | Status: AC
  Filled 2015-04-17: qty 1

## 2015-04-17 NOTE — ED Notes (Signed)
Pt in X ray

## 2015-04-17 NOTE — ED Notes (Signed)
Pt to ed with c/o cough and congestion x 4 weeks.  Pt states over the last week worse with cough and sob.  Pt states she feels sob even with talking today.  Also reports sob with activity.  EKG done at triage.

## 2015-04-17 NOTE — ED Provider Notes (Signed)
[facility] Emergency Department Provider Note     Time seen: ----------------------------------------- 11:36 AM on 04/17/2015 -----------------------------------------    I have reviewed the triage vital signs and the nursing notes.   HISTORY  Chief Complaint Shortness of Breath and Cough    HPI Courtney Pruitt is a 81 y.o. female who complains shortness of breath and congestion for last 4 weeks, worse over the last 24 hours. She states she coughed all night and at rest. She states she feels short of breath even talking today also reports shortness of breath with activity. Denies any fevers chills or other complaints. Symptoms currently mild to moderate this time.   Past Medical History  Diagnosis Date  . Hypertension   . CHF (congestive heart failure)   . Hyperlipidemia   . Atrial fibrillation     There are no active problems to display for this patient.   Past Surgical History  Procedure Laterality Date  . Knee surgery    . Pacemaker insertion      Allergies Review of patient's allergies indicates not on file.  Social History History  Substance Use Topics  . Smoking status: Never Smoker   . Smokeless tobacco: Not on file  . Alcohol Use: No    Review of Systems Constitutional: Negative for fever. Eyes: Negative for visual changes. ENT: Negative for sore throat. Cardiovascular: Negative for chest pain. Respiratory: Positive shortness of breath and cough Gastrointestinal: Negative for abdominal pain, vomiting and diarrhea. Genitourinary: Negative for dysuria. Musculoskeletal: Negative for back pain. Skin: Negative for rash. Neurological: Negative for headaches, focal weakness or numbness.  10-point ROS otherwise negative.  ____________________________________________   PHYSICAL EXAM:  VITAL SIGNS: ED Triage Vitals  Enc Vitals Group     BP 04/17/15 1029 134/57 mmHg     Pulse Rate 04/17/15 1029 62     Resp 04/17/15 1029 20      Temp 04/17/15 1029 98.2 F (36.8 C)     Temp Source 04/17/15 1029 Oral     SpO2 04/17/15 1029 98 %     Weight 04/17/15 1029 133 lb (60.328 kg)     Height 04/17/15 1029 5\' 2"  (1.575 m)     Head Cir --      Peak Flow --      Pain Score 04/17/15 1033 0     Pain Loc --      Pain Edu? --      Excl. in GC? --     Constitutional: Alert and oriented. Well appearing and in no distress. Eyes: Conjunctivae are normal. PERRL. Normal extraocular movements. ENT   Head: Normocephalic and atraumatic.   Nose: No congestion/rhinnorhea.   Mouth/Throat: Mucous membranes are moist.   Neck: No stridor. Hematological/Lymphatic/Immunilogical: No cervical lymphadenopathy. Cardiovascular: Normal rate, regular rhythm. Normal and symmetric distal pulses are present in all extremities. No murmurs, rubs, or gallops. Respiratory: Normal respiratory effort without tachypnea nor retractions. Breath sounds are clear and equal bilaterally. Trace wheezing Gastrointestinal: Soft and nontender. No distention. No abdominal bruits. There is no CVA tenderness. Musculoskeletal: Nontender with normal range of motion in all extremities. No joint effusions.  No lower extremity tenderness nor edema. Neurologic:  Normal speech and language. No gross focal neurologic deficits are appreciated. Speech is normal. No gait instability. Skin:  Skin is warm, dry and intact. No rash noted. Psychiatric: Mood and affect are normal. Speech and behavior are normal. Patient exhibits appropriate insight and judgment. ____________________________________________  EKG: Interpreted by me. AV sequential  pacemaker with a rate of 60 no evidence of acute infarction  ____________________________________________  ED COURSE:  Pertinent labs & imaging results that were available during my care of the patient were reviewed by me and considered in my medical decision making (see chart for details). We'll check cardiac labs, x-ray and  reevaluate. ____________________________________________    LABS (pertinent positives/negatives)  Labs Reviewed  CBC - Abnormal; Notable for the following:    RBC 3.69 (*)    Hemoglobin 11.4 (*)    HCT 33.7 (*)    All other components within normal limits  BASIC METABOLIC PANEL - Abnormal; Notable for the following:    Sodium 127 (*)    Chloride 93 (*)    BUN 21 (*)    GFR calc non Af Amer 55 (*)    All other components within normal limits  BRAIN NATRIURETIC PEPTIDE - Abnormal; Notable for the following:    B Natriuretic Peptide 365.0 (*)    All other components within normal limits  TROPONIN I    RADIOLOGY  Chest x-ray IMPRESSION: COPD changes with increased interstitial markings/infiltrate since previous exam, greatest in RIGHT upper lobe, question asymmetric edema or less likely pneumonia.  Small bibasilar effusions and minimal atelectasis greater on RIGHT.  Post pacemaker/AICD placement. ____________________________________________  FINAL ASSESSMENT AND PLAN  Dyspnea and cough  Plan: Patient with what looks like more COPD changes in her lungs. Questionable right upper lobe infiltrate. We'll discharge her with antibiotics, steroids and albuterol. This does not appear to be congestive heart failure related, she'll continue her home medications follow up with Dr. Hyacinth Meeker for reevaluation.   Emily Filbert, MD   Emily Filbert, MD 04/17/15 [phone]

## 2015-04-17 NOTE — Discharge Instructions (Signed)
Cough, Adult  A cough is a reflex that helps clear your throat and airways. It can help heal the body or may be a reaction to an irritated airway. A cough may only last 2 or 3 weeks (acute) or may last more than 8 weeks (chronic).  CAUSES Acute cough:  Viral or bacterial infections. Chronic cough:  Infections.  Allergies.  Asthma.  Post-nasal drip.  Smoking.  Heartburn or acid reflux.  Some medicines.  Chronic lung problems (COPD).  Cancer. SYMPTOMS   Cough.  Fever.  Chest pain.  Increased breathing rate.  High-pitched whistling sound when breathing (wheezing).  Colored mucus that you cough up (sputum). TREATMENT   A bacterial cough may be treated with antibiotic medicine.  A viral cough must run its course and will not respond to antibiotics.  Your caregiver may recommend other treatments if you have a chronic cough. HOME CARE INSTRUCTIONS   Only take over-the-counter or prescription medicines for pain, discomfort, or fever as directed by your caregiver. Use cough suppressants only as directed by your caregiver.  Use a cold steam vaporizer or humidifier in your bedroom or home to help loosen secretions.  Sleep in a semi-upright position if your cough is worse at night.  Rest as needed.  Stop smoking if you smoke. SEEK IMMEDIATE MEDICAL CARE IF:   You have pus in your sputum.  Your cough starts to worsen.  You cannot control your cough with suppressants and are losing sleep.  You begin coughing up blood.  You have difficulty breathing.  You develop pain which is getting worse or is uncontrolled with medicine.  You have a fever. MAKE SURE YOU:   Understand these instructions.  Will watch your condition.  Will get help right away if you are not doing well or get worse. Document Released: 04/22/2011 Document Revised: 01/16/2012 Document Reviewed: 04/22/2011 [facility] Patient Information 2015 [place], [place]. This information is not intended  to replace advice given to you by your health care provider. Make sure you discuss any questions you have with your health care provider.  Shortness of Breath Shortness of breath means you have trouble breathing. Shortness of breath needs medical care right away. HOME CARE   Do not smoke.  Avoid being around chemicals or things (paint fumes, dust) that may bother your breathing.  Rest as needed. Slowly begin your normal activities.  Only take medicines as told by your doctor.  Keep all doctor visits as told. GET HELP RIGHT AWAY IF:   Your shortness of breath gets worse.  You feel lightheaded, pass out (faint), or have a cough that is not helped by medicine.  You cough up blood.  You have pain with breathing.  You have pain in your chest, arms, shoulders, or belly (abdomen).  You have a fever.  You cannot walk up stairs or exercise the way you normally do.  You do not get better in the time expected.  You have a hard time doing normal activities even with rest.  You have problems with your medicines.  You have any new symptoms. MAKE SURE YOU:  Understand these instructions.  Will watch your condition.  Will get help right away if you are not doing well or get worse. Document Released: 04/11/2008 Document Revised: 10/29/2013 Document Reviewed: 01/09/2012 [facility] Patient Information 2015 [place], [place]. This information is not intended to replace advice given to you by your health care provider. Make sure you discuss any questions you have with your health care provider.

## 2015-12-30 ENCOUNTER — Other Ambulatory Visit: Payer: Self-pay | Admitting: Internal Medicine

## 2015-12-30 DIAGNOSIS — S32000A Wedge compression fracture of unspecified lumbar vertebra, initial encounter for closed fracture: Secondary | ICD-10-CM

## 2015-12-30 DIAGNOSIS — M5116 Intervertebral disc disorders with radiculopathy, lumbar region: Secondary | ICD-10-CM

## 2016-01-04 ENCOUNTER — Ambulatory Visit: Payer: Medicare Other

## 2016-01-13 ENCOUNTER — Other Ambulatory Visit: Payer: Self-pay | Admitting: Orthopedic Surgery

## 2016-01-13 DIAGNOSIS — S32020A Wedge compression fracture of second lumbar vertebra, initial encounter for closed fracture: Secondary | ICD-10-CM

## 2016-01-18 ENCOUNTER — Inpatient Hospital Stay: Admission: RE | Admit: 2016-01-18 | Payer: Medicare Other | Source: Ambulatory Visit

## 2016-01-19 ENCOUNTER — Inpatient Hospital Stay: Admission: RE | Admit: 2016-01-19 | Payer: Medicare Other | Source: Ambulatory Visit

## 2016-01-26 ENCOUNTER — Ambulatory Visit: Admission: RE | Admit: 2016-01-26 | Payer: Medicare Other | Source: Ambulatory Visit | Admitting: Orthopedic Surgery

## 2016-01-26 ENCOUNTER — Encounter: Admission: RE | Payer: Self-pay | Source: Ambulatory Visit

## 2016-01-26 SURGERY — KYPHOPLASTY
Anesthesia: Choice

## 2016-02-22 ENCOUNTER — Emergency Department
Admission: EM | Admit: 2016-02-22 | Discharge: 2016-02-22 | Disposition: A | Discharge status: Home / Self Care | Payer: Medicare Other | Attending: Emergency Medicine | Admitting: Emergency Medicine

## 2016-02-22 ENCOUNTER — Encounter: Payer: Self-pay | Admitting: Emergency Medicine

## 2016-02-22 DIAGNOSIS — I509 Heart failure, unspecified: Secondary | ICD-10-CM | POA: Diagnosis not present

## 2016-02-22 DIAGNOSIS — R339 Retention of urine, unspecified: Secondary | ICD-10-CM | POA: Diagnosis not present

## 2016-02-22 DIAGNOSIS — I10 Essential (primary) hypertension: Secondary | ICD-10-CM | POA: Diagnosis not present

## 2016-02-22 DIAGNOSIS — Z79899 Other long term (current) drug therapy: Secondary | ICD-10-CM | POA: Diagnosis not present

## 2016-02-22 DIAGNOSIS — R338 Other retention of urine: Secondary | ICD-10-CM

## 2016-02-22 DIAGNOSIS — E785 Hyperlipidemia, unspecified: Secondary | ICD-10-CM | POA: Diagnosis not present

## 2016-02-22 DIAGNOSIS — I4891 Unspecified atrial fibrillation: Secondary | ICD-10-CM | POA: Diagnosis not present

## 2016-02-22 LAB — URINALYSIS COMPLETE WITH MICROSCOPIC (ARMC ONLY)
Bacteria, UA: NONE SEEN
Bilirubin Urine: NEGATIVE
Glucose, UA: NEGATIVE mg/dL
HGB URINE DIPSTICK: NEGATIVE
Ketones, ur: NEGATIVE mg/dL
Leukocytes, UA: NEGATIVE
NITRITE: NEGATIVE
Protein, ur: NEGATIVE mg/dL
Specific Gravity, Urine: 1.011 (ref 1.005–1.030)
pH: 7 (ref 5.0–8.0)

## 2016-02-22 LAB — CBC WITH DIFFERENTIAL/PLATELET
BASOS ABS: 0 10*3/uL (ref 0–0.1)
BASOS PCT: 1 %
EOS PCT: 5 %
Eosinophils Absolute: 0.3 10*3/uL (ref 0–0.7)
HEMATOCRIT: 33.8 % — AB (ref 35.0–47.0)
Hemoglobin: 11.6 g/dL — ABNORMAL LOW (ref 12.0–16.0)
Lymphocytes Relative: 21 %
Lymphs Abs: 1.3 10*3/uL (ref 1.0–3.6)
MCH: 33 pg (ref 26.0–34.0)
MCHC: 34.4 g/dL (ref 32.0–36.0)
MCV: 96 fL (ref 80.0–100.0)
MONO ABS: 0.7 10*3/uL (ref 0.2–0.9)
MONOS PCT: 11 %
Neutro Abs: 3.7 10*3/uL (ref 1.4–6.5)
Neutrophils Relative %: 62 %
PLATELETS: 186 10*3/uL (ref 150–440)
RBC: 3.52 MIL/uL — ABNORMAL LOW (ref 3.80–5.20)
RDW: 15.3 % — AB (ref 11.5–14.5)
WBC: 6 10*3/uL (ref 3.6–11.0)

## 2016-02-22 LAB — BASIC METABOLIC PANEL
Anion gap: 6 (ref 5–15)
BUN: 23 mg/dL — AB (ref 6–20)
CALCIUM: 9.5 mg/dL (ref 8.9–10.3)
CHLORIDE: 96 mmol/L — AB (ref 101–111)
CO2: 29 mmol/L (ref 22–32)
CREATININE: 0.83 mg/dL (ref 0.44–1.00)
GFR calc Af Amer: >60 mL/min (ref >60)
GLUCOSE: 94 mg/dL (ref 65–99)
Potassium: 4 mmol/L (ref 3.5–5.1)
Sodium: 131 mmol/L — ABNORMAL LOW (ref 135–145)

## 2016-02-22 NOTE — ED Notes (Signed)
Foley catheter in place and secured.  Plain drainage bag in place and teaching given to patient and patient daughter with good return demonstration and feedback.

## 2016-02-22 NOTE — ED Notes (Signed)
AAOx3.  Skin warm and dry.  NAD 

## 2016-02-22 NOTE — ED Notes (Signed)
Pt presents to ED with complaints of inability to urinate. Pt states last void was last night around 9:30 pm. Pt denies pain. Pt denies dysuria upon last urination. Pt reports adequate PO intake.

## 2016-02-22 NOTE — ED Provider Notes (Signed)
[facility] Emergency Department Provider Note   ____________________________________________  Time seen: Approximately 6 PM I have reviewed the triage vital signs and the triage nursing note.  HISTORY  Chief Complaint Urinary Retention   Historian Patient and husband  HPI Courtney Pruitt is a 82 y.o. female who states that she has been trying to urinate all day long and has been unable to void. No abdominal pain. No flank pain. No fever. No dysuria. Her husband states that she drinks 36-40 ounces of fluids daily, 3 tall glasses of water as well as insure, and he does not think there is any reason why she should be dehydrated. No vomiting or diarrhea. No trouble breathing. No swelling. She occasionally has minimal lower extremity edema for which she wears some compression stockings.  She made a urologist appointment for tomorrow with Dr. Annabell HowellsWrenn, at 2:30 in the afternoon.  When she called the nurse line, she was told to come to emergency department for evaluation.    Past Medical History  Diagnosis Date  . Hypertension   . CHF (congestive heart failure) (HCC)   . Hyperlipidemia   . Atrial fibrillation (HCC)     There are no active problems to display for this patient.   Past Surgical History  Procedure Laterality Date  . Knee surgery    . Pacemaker insertion      Current Outpatient Rx  Name  Route  Sig  Dispense  Refill  . acetaminophen (TYLENOL) 500 MG tablet   Oral   Take 500 mg by mouth 2 (two) times daily as needed for moderate pain.         Marland Kitchen. albuterol (PROVENTIL HFA;VENTOLIN HFA) 108 (90 BASE) MCG/ACT inhaler   Inhalation   Inhale 2 puffs into the lungs every 6 (six) hours as needed for wheezing or shortness of breath.   1 Inhaler   2   . amLODipine (NORVASC) 2.5 MG tablet   Oral   Take 2.5 mg by mouth daily.         Marland Kitchen. ascorbic acid (CVS VITAMIN C) 500 MG tablet   Oral   Take 500 mg by mouth daily.         Marland Kitchen. azithromycin  (ZITHROMAX Z-PAK) 250 MG tablet      Take 2 tablets (500 mg) on  Day 1,  followed by 1 tablet (250 mg) once daily on Days 2 through 5.   6 each   0   . calcium carbonate 1250 MG capsule   Oral   Take 1,250 mg by mouth 2 (two) times daily with a meal.         . cetirizine (ZYRTEC) 10 MG tablet   Oral   Take 10 mg by mouth daily.         . Cranberry 500 MG CAPS   Oral   Take 1 capsule by mouth daily.         Marland Kitchen. CRANBERRY CONCENTRATE PO   Oral   Take 1 Dose by mouth 2 (two) times daily.         . furosemide (LASIX) 40 MG tablet   Oral   Take 80 mg by mouth daily.         . metoprolol tartrate (LOPRESSOR) 25 MG tablet   Oral   Take 25 mg by mouth 2 (two) times daily.         . Multiple Vitamins-Minerals (MULTIVITAMIN ADULT PO)   Oral   Take 1 tablet by  mouth daily.         . potassium chloride (K-DUR,KLOR-CON) 10 MEQ tablet   Oral   Take 10 mEq by mouth daily.         . predniSONE (DELTASONE) 50 MG tablet      One tablet by mouth daily   4 tablet   0   . psyllium (METAMUCIL) 58.6 % packet   Oral   Take 1 packet by mouth at bedtime.         . simvastatin (ZOCOR) 10 MG tablet   Oral   Take 10 mg by mouth at bedtime.         . sotalol (BETAPACE) 80 MG tablet   Oral   Take 80 mg by mouth daily.         Marland Kitchen warfarin (COUMADIN) 2.5 MG tablet   Oral   Take 2.5-5 mg by mouth daily. Patient takes 1 tablet (2.5 mg) by mouth Monday thru Friday. On Saturday and Sunday, patients takes 2 tablets (5 mg).           Allergies Ace inhibitors; Amiodarone; Codeine; Fluorescein; Iodinated diagnostic agents; Nitrofurantoin; Cinoxacin; Penicillins; and Sulfa antibiotics  No family history on file.  Social History Social History  Substance Use Topics  . Smoking status: Never Smoker   . Smokeless tobacco: None  . Alcohol Use: No    Review of Systems  Constitutional: Negative for fever. Eyes: Negative for visual changes. ENT: Negative for sore  throat. Cardiovascular: Negative for chest pain. Respiratory: Negative for shortness of breath. Gastrointestinal: Negative for abdominal pain, vomiting and diarrhea. Genitourinary: Negative for dysuria.Urinary frequency, but unable to void. Musculoskeletal: Negative for back pain. Skin: Negative for rash. Neurological: Negative for headache. 10 point Review of Systems otherwise negative ____________________________________________   PHYSICAL EXAM:  VITAL SIGNS: ED Triage Vitals  Enc Vitals Group     BP 02/22/16 1542 135/69 mmHg     Pulse Rate 02/22/16 1542 68     Resp 02/22/16 1542 18     Temp --      Temp src --      SpO2 02/22/16 1542 96 %     Weight 02/22/16 1542 128 lb (58.06 kg)     Height 02/22/16 1542  (1.575 m)     Head Cir --      Peak Flow --      Pain Score --      Pain Loc --      Pain Edu? --      Excl. in GC? --      Constitutional: Alert and oriented. Well appearing and in no distress. HEENT   Head: Normocephalic and atraumatic.      Eyes: Conjunctivae are normal. PERRL. Normal extraocular movements.      Ears:         Nose: No congestion/rhinnorhea.   Mouth/Throat: Mucous membranes are moist.   Neck: No stridor. Cardiovascular/Chest: Normal rate, regular rhythm.  No murmurs, rubs, or gallops. Respiratory: Normal respiratory effort without tachypnea nor retractions. Breath sounds are clear and equal bilaterally. No wheezes/rales/rhonchi. Gastrointestinal: Soft. No distention, no guarding, no rebound. Nontender.   Genitourinary/rectal:Deferred Musculoskeletal: Nontender with normal range of motion in all extremities. No joint effusions.  No lower extremity tenderness.  No edema. Neurologic:  Normal speech and language. No gross or focal neurologic deficits are appreciated. Skin:  Skin is warm, dry and intact. No rash noted. Psychiatric: Mood and affect are normal. Speech and behavior are normal. Patient  exhibits appropriate insight and  judgment.  ____________________________________________   EKG I, Governor Rooks, MD, the attending physician have personally viewed and interpreted all ECGs.  None ____________________________________________  LABS (pertinent positives/negatives)  Urinalysis negative for ketones, nitrites, red blood cells, white blood cells, or bacteria White blood count 6.0, hemoglobin 11.6 and platelet count 186 Sodium 131, BUN 23 and creatinine 0.83  ____________________________________________  RADIOLOGY All Xrays were viewed by me. Imaging interpreted by Radiologist.  None __________________________________________  PROCEDURES  Procedure(s) performed: None  Critical Care performed: None  ____________________________________________   ED COURSE / ASSESSMENT AND PLAN  Pertinent labs & imaging results that were available during my care of the patient were reviewed by me and considered in my medical decision making (see chart for details).   Placed Foley catheter after patient complained of not having voided all night and all day, and 150-200 cc of normal-appearing urine came out.  On exam she has soft and nontender abdomen, and she is not complaining of flank pain.  I discussed with her that I'm unsure whether or not she is having acute urinary retention, she certainly from a volume standpoint did not have any overdistended bladder, but since the catheter is already in, and family does not think dehydration/low urine volume was the source of her being unable to urinate, we decided to go ahead and leave the Foley catheter and converted to a leg bag and let her follow up with urology.  I did discuss with her that this would likely me leaving the catheter in for a week or so.  I chose to check labs for renal function in case this is acute renal failure causing decreased urine output.  She's not giving a history of volume depleted state.  No elevated white blood cell count. BUN  slightly elevated 23, no elevation in creatinine. Sodium slightly low, but higher than previous, the CDC chronic hyponatremia.  Patient be discharged home with catheter in place. She does have a urology appointment tomorrow.  CONSULTATIONS:   None   Patient / Family / Caregiver informed of clinical course, medical decision-making process, and agree with plan.   I discussed return precautions, follow-up instructions, and discharged instructions with patient and/or family.   ___________________________________________   FINAL CLINICAL IMPRESSION(S) / ED DIAGNOSES   Final diagnoses:  Acute urinary retention              Note: This dictation was prepared with Dragon dictation. Any transcriptional errors that result from this process are unintentional   Governor Rooks, MD 02/22/16 2021

## 2016-02-22 NOTE — Discharge Instructions (Signed)
You were evaluated for trouble urinating, and a catheter was placed. Return to emergency room for any worsening condition including vomiting, abdominal pain, dizziness or passing out, or any other symptoms concerning to you.   Acute Urinary Retention, Female Urinary retention means you are unable to pee completely or at all (empty your bladder). HOME CARE  Drink enough fluids to keep your pee (urine) clear or pale yellow.  If you are sent home with a tube that drains the bladder (catheter), there will be a drainage bag attached to it. There are two types of bags. One is big that you can wear at night without having to empty it. One is smaller and needs to be emptied more often.  Keep the drainage bag emptied.  Keep the drainage bag lower than the tube.  Only take medicine as told by your doctor. GET HELP IF:  You have a low-grade fever.  You have spasms or you are leaking pee when you have spasms. GET HELP RIGHT AWAY IF:   You have chills or a fever.  Your catheter stops draining pee.  Your catheter falls out.  You have increased bleeding that does not stop after you have rested and increased the amount of fluids you had been drinking. MAKE SURE YOU:   Understand these instructions.  Will watch your condition.  Will get help right away if you are not doing well or get worse.   This information is not intended to replace advice given to you by your health care provider. Make sure you discuss any questions you have with your health care provider.   Document Released: 04/11/2008 Document Revised: 03/10/2015 Document Reviewed: 04/04/2013 Elsevier Interactive Patient Education [employer]2016 Elsevier Inc.

## 2016-02-23 ENCOUNTER — Encounter: Payer: Medicare Other | Admitting: Urology

## 2016-02-23 NOTE — Progress Notes (Signed)
Pt.  Not seen today This encounter was created in error - please disregard.

## 2016-02-24 LAB — URINE CULTURE: CULTURE: NO GROWTH

## 2016-02-29 ENCOUNTER — Encounter: Payer: Self-pay | Admitting: Urology

## 2016-02-29 ENCOUNTER — Ambulatory Visit (INDEPENDENT_AMBULATORY_CARE_PROVIDER_SITE_OTHER): Payer: Medicare Other | Admitting: Urology

## 2016-02-29 VITALS — BP 157/89 | HR 79 | Temp 98.0°F | Resp 16 | Wt 125.0 lb

## 2016-02-29 DIAGNOSIS — R339 Retention of urine, unspecified: Secondary | ICD-10-CM

## 2016-02-29 NOTE — Progress Notes (Signed)
02/29/2016 10:55 AM   Courtney Pruitt [birth year] [id number]  Referring provider: Danella PentonMark F Miller, MD [phone] [facility] [address] [facility] [place], [place] [id number]  Chief Complaint  Patient presents with  . New Patient (Initial Visit)  . Urinary Retention    HPI: Patient is an 82 year old Caucasian female who is seen today after emergency room visit for the inability to urinate on 02/22/2016.  Patient is a poor historian, but she presents today with her husband and son.  Patient was seen in the emergency room at [facility] on 02/22/2016.  At that time, she stated that she went all day and all night and didn't urinate.  She then felt as though she was getting tight in the abdominal area and sought treatment in the emergency room.  A Foley catheter was placed at that time and 200 cc of normal-appearing urine came out.  She was then seen at [facility] in [place] and instructed to follow-up with us in [place] in 1 week for a fill and pull.    Patient is on slight fluid restrictions due to her CHF.  Her baseline urinary symptoms consist of frequency, urgency, nocturia, incontinence, hesitancy and straining to urinate.  She is wearing one to 3 pads daily.  She does drink 30-40 ounces daily as instructed by cardiology.  She is not had gross hematuria, dysuria or suprapubic pain. She has not had any recent fevers, chills, nausea or vomiting.   PMH: Past Medical History  Diagnosis Date  . Hypertension   . CHF (congestive heart failure) (HCC)   . Hyperlipidemia   . Atrial fibrillation [facility](HCC)     Surgical History: Past Surgical History  Procedure Laterality Date  . Knee surgery    . Pacemaker insertion      Home Medications:    Medication List       This list is accurate as of: 02/29/16 10:55 AM.  Always use your most recent med list.               acetaminophen 500 MG tablet  Commonly known as:  TYLENOL  Take 500 mg by mouth 2  (two) times daily as needed for moderate pain.     albuterol 108 (90 Base) MCG/ACT inhaler  Commonly known as:  PROVENTIL HFA;VENTOLIN HFA  Inhale 2 puffs into the lungs every 6 (six) hours as needed for wheezing or shortness of breath.     amLODipine 2.5 MG tablet  Commonly known as:  NORVASC  Take 2.5 mg by mouth daily.     azithromycin 250 MG tablet  Commonly known as:  ZITHROMAX Z-PAK  Take 2 tablets (500 mg) on  Day 1,  followed by 1 tablet (250 mg) once daily on Days 2 through 5.     calcium carbonate 1250 MG capsule  Take 1,250 mg by mouth 2 (two) times daily with a meal.     cetirizine 10 MG tablet  Commonly known as:  ZYRTEC  Take 10 mg by mouth daily.     Cranberry 500 MG Caps  Take 1 capsule by mouth daily.     CRANBERRY CONCENTRATE PO  Take 1 Dose by mouth 2 (two) times daily.     CVS VITAMIN C 500 MG tablet  Generic drug:  ascorbic acid  Take 500 mg by mouth daily.     furosemide 40 MG tablet  Commonly known as:  LASIX  Take 80 mg by mouth daily.  metoprolol tartrate 25 MG tablet  Commonly known as:  LOPRESSOR  Take 25 mg by mouth 2 (two) times daily.     MULTIVITAMIN ADULT PO  Take 1 tablet by mouth daily.     potassium chloride 10 MEQ tablet  Commonly known as:  K-DUR,KLOR-CON  Take 10 mEq by mouth daily.     predniSONE 50 MG tablet  Commonly known as:  DELTASONE  One tablet by mouth daily     psyllium 58.6 % packet  Commonly known as:  METAMUCIL  Take 1 packet by mouth at bedtime.     simvastatin 10 MG tablet  Commonly known as:  ZOCOR  Take 10 mg by mouth at bedtime.     sotalol 80 MG tablet  Commonly known as:  BETAPACE  Take 80 mg by mouth daily.     warfarin 2.5 MG tablet  Commonly known as:  COUMADIN  Take 2.5-5 mg by mouth daily. Patient takes 1 tablet (2.5 mg) by mouth Monday thru Friday. On Saturday and Sunday, patients takes 2 tablets (5 mg).        Allergies:  Allergies  Allergen Reactions  . Ace Inhibitors Other  (See Comments)    Reaction: Unknown  . Amiodarone Other (See Comments)    Reaction: Balance difficulties  . Codeine Other (See Comments)    Reaction: GI Upset  . Fluorescein Itching and Other (See Comments)    Reaction: Pt also experienced throat tightness (symptoms subsided when given benadryl).   . Iodinated Diagnostic Agents Itching and Other (See Comments)    Reaction: Pt also experienced throat tightness (symptoms subsided when given benadryl).   . Nitrofurantoin Other (See Comments)    Reaction: Unknown  . Cinoxacin Rash    Cinobac  . Penicillins Rash  . Sulfa Antibiotics Rash    Family History: Family History  Problem Relation Age of Onset  . Cancer Mother     bladder cancer   . Cancer Father     lung cancer    Social History:  reports that she has never smoked. She does not have any smokeless tobacco history on file. She reports that she does not drink alcohol or use illicit drugs.  ROS: UROLOGY Frequent Urination?: Yes Hard to postpone urination?: Yes Burning/pain with urination?: No Get up at night to urinate?: Yes Leakage of urine?: Yes Urine stream starts and stops?: No Trouble starting stream?: Yes Do you have to strain to urinate?: Yes Blood in urine?: No Urinary tract infection?: No Sexually transmitted disease?: No Injury to kidneys or bladder?: No Painful intercourse?: No Weak stream?: No Currently pregnant?: No Vaginal bleeding?: No Last menstrual period?: n  Gastrointestinal Nausea?: No Vomiting?: No Indigestion/heartburn?: No Diarrhea?: No Constipation?: Yes  Constitutional Night sweats?: No Weight loss?: No Fatigue?: Yes  Skin Skin rash/lesions?: Yes Itching?: No  Eyes Blurred vision?: No Double vision?: No  Ears/Nose/Throat Sore throat?: No Sinus problems?: Yes  Hematologic/Lymphatic Swollen glands?: No Easy bruising?: No  Cardiovascular Leg swelling?: No Chest pain?: No  Respiratory Cough?: Yes Shortness of  breath?: Yes  Endocrine Excessive thirst?: Yes  Musculoskeletal Back pain?: Yes Joint pain?: Yes  Neurological Headaches?: Yes Dizziness?: No  Psychologic Depression?: Yes Anxiety?: Yes  Physical Exam: BP 157/89 mmHg  Pulse 79  Temp(Src) 98 F (36.7 C)  Resp 16  Wt 125 lb (56.7 kg)  Constitutional: Well nourished. Alert and oriented, No acute distress. HEENT: [place] AT, moist mucus membranes. Trachea midline, no masses. Cardiovascular: No clubbing, cyanosis, or  edema. Respiratory: Normal respiratory effort, no increased work of breathing. GI: Abdomen is soft, non tender, non distended, no abdominal masses. Liver and spleen not palpable.  No hernias appreciated.  Stool sample for occult testing is not indicated.   GU: No CVA tenderness.  No bladder fullness or masses.  Normal external genitalia, normal pubic hair distribution, no lesions.  Normal urethral meatus, no lesions, no prolapse, no discharge.   No urethral masses, tenderness and/or tenderness. No bladder fullness, tenderness or masses. Normal vagina mucosa, good estrogen effect, no discharge, no lesions, good pelvic support, Grade II cystocele is noted.  Rectocele is not noted.  No cervical motion tenderness.  Uterus is freely mobile and non-fixed.  No adnexal/parametria masses or tenderness noted.  Anus and perineum are without rashes or lesions.    Skin: No rashes, bruises or suspicious lesions. Lymph: No cervical or inguinal adenopathy. Neurologic: Grossly intact, no focal deficits, moving all 4 extremities. Psychiatric: Normal mood and affect.  Laboratory Data: Lab Results  Component Value Date   WBC 6.0 02/22/2016   HGB 11.6* 02/22/2016   HCT 33.8* 02/22/2016   MCV 96.0 02/22/2016   PLT 186 02/22/2016    Lab Results  Component Value Date   CREATININE 0.83 02/22/2016    Lab Results  Component Value Date   AST 56* 03/05/2014   Lab Results  Component Value Date   ALT 33 03/05/2014    Fill and Pull  Catheter Removal  Patient is present today for a catheter removal.  Patient was cleaned and prepped in a sterile fashion 180 ml of sterile water/ saline was instilled into the bladder when the patient felt the urge to urinate. 10 ml of water was then drained from the balloon.  A 16 FR foley cath was removed from the bladder no complications were noted .  Patient as then given some time to void on their own.  Patient voided  120 ml on their own.  Patient tolerated well.  Preformed by: Michiel Cowboy, PA-C   Assessment & Plan:    1. Incomplete bladder emptying:   Patient passed her fill and spill at today's appointment.  She does not want to return to our office in 1 month as suggested for a symptom recheck and PVR.  She stated that she will contact us if she should experience any difficulty with urination.  Her  husband and son are agreeable to this plan.  Patient will contact us if she experiences any difficulty with urination, gross hematuria or suprapubic pain.   Return if symptoms worsen or fail to improve.  These notes generated with voice recognition software. I apologize for typographical errors.  Michiel Cowboy, PA-C  [facility] [address] [place], [place] [address] [phone]

## 2016-03-04 ENCOUNTER — Ambulatory Visit (INDEPENDENT_AMBULATORY_CARE_PROVIDER_SITE_OTHER): Payer: Medicare Other

## 2016-03-04 ENCOUNTER — Telehealth: Payer: Self-pay | Admitting: Urology

## 2016-03-04 DIAGNOSIS — R339 Retention of urine, unspecified: Secondary | ICD-10-CM

## 2016-03-04 DIAGNOSIS — R3981 Functional urinary incontinence: Secondary | ICD-10-CM

## 2016-03-04 LAB — BLADDER SCAN AMB NON-IMAGING: Scan Result: 23

## 2016-03-04 NOTE — Telephone Encounter (Signed)
Spoke with pt in reference to not being able to urinate. Pt is going to come into the office for a bladder scan. Nurse appt made.

## 2016-03-04 NOTE — Telephone Encounter (Signed)
Pt called saying she's been having difficulty urinating since yesterday. She's taken three furosemide pills and she has "a little dribble" but she's still struggling even with that. She'd like a phone call regarding this.   Pt's ph# [phone] Thank you.

## 2016-03-04 NOTE — Progress Notes (Signed)
Bladder Scan:23 Patient cannot void:  Performed By: Rupert Stackshelsea Watkins, LPN   Pt called stating she was not able to urinate. Therefore pt came to clinic for a PVR. Bladder was empty. Pt stated she has been drinking water all day and took lasix but was not able to urinate. Pt has a dx of CHF and pt stated that she has been feeling as though "my CHF is acting up". Reinforced with pt if she develops pain, n/v, f/c over the weekend to go to the ER. Pt voiced understanding.

## 2016-03-05 ENCOUNTER — Emergency Department: Payer: Medicare Other

## 2016-03-05 ENCOUNTER — Encounter: Payer: Self-pay | Admitting: Emergency Medicine

## 2016-03-05 ENCOUNTER — Observation Stay
Admission: EM | Admit: 2016-03-05 | Discharge: 2016-03-08 | Disposition: A | Discharge status: Skilled Nursing Facility | Payer: Medicare Other | Attending: Internal Medicine | Admitting: Internal Medicine

## 2016-03-05 DIAGNOSIS — M810 Age-related osteoporosis without current pathological fracture: Secondary | ICD-10-CM | POA: Diagnosis not present

## 2016-03-05 DIAGNOSIS — R0902 Hypoxemia: Secondary | ICD-10-CM | POA: Diagnosis not present

## 2016-03-05 DIAGNOSIS — Z885 Allergy status to narcotic agent status: Secondary | ICD-10-CM | POA: Diagnosis not present

## 2016-03-05 DIAGNOSIS — Z888 Allergy status to other drugs, medicaments and biological substances status: Secondary | ICD-10-CM | POA: Diagnosis not present

## 2016-03-05 DIAGNOSIS — Z882 Allergy status to sulfonamides status: Secondary | ICD-10-CM | POA: Diagnosis not present

## 2016-03-05 DIAGNOSIS — I4891 Unspecified atrial fibrillation: Secondary | ICD-10-CM | POA: Insufficient documentation

## 2016-03-05 DIAGNOSIS — M40204 Unspecified kyphosis, thoracic region: Secondary | ICD-10-CM | POA: Insufficient documentation

## 2016-03-05 DIAGNOSIS — M545 Low back pain: Secondary | ICD-10-CM | POA: Insufficient documentation

## 2016-03-05 DIAGNOSIS — M2578 Osteophyte, vertebrae: Secondary | ICD-10-CM | POA: Insufficient documentation

## 2016-03-05 DIAGNOSIS — M8580 Other specified disorders of bone density and structure, unspecified site: Secondary | ICD-10-CM | POA: Diagnosis not present

## 2016-03-05 DIAGNOSIS — M542 Cervicalgia: Secondary | ICD-10-CM | POA: Insufficient documentation

## 2016-03-05 DIAGNOSIS — S0990XA Unspecified injury of head, initial encounter: Secondary | ICD-10-CM | POA: Diagnosis not present

## 2016-03-05 DIAGNOSIS — Z79899 Other long term (current) drug therapy: Secondary | ICD-10-CM | POA: Diagnosis not present

## 2016-03-05 DIAGNOSIS — Z950 Presence of cardiac pacemaker: Secondary | ICD-10-CM | POA: Insufficient documentation

## 2016-03-05 DIAGNOSIS — R531 Weakness: Secondary | ICD-10-CM | POA: Insufficient documentation

## 2016-03-05 DIAGNOSIS — J90 Pleural effusion, not elsewhere classified: Secondary | ICD-10-CM | POA: Diagnosis not present

## 2016-03-05 DIAGNOSIS — I739 Peripheral vascular disease, unspecified: Secondary | ICD-10-CM | POA: Insufficient documentation

## 2016-03-05 DIAGNOSIS — IMO0002 Reserved for concepts with insufficient information to code with codable children: Secondary | ICD-10-CM

## 2016-03-05 DIAGNOSIS — I10 Essential (primary) hypertension: Secondary | ICD-10-CM | POA: Diagnosis not present

## 2016-03-05 DIAGNOSIS — R52 Pain, unspecified: Secondary | ICD-10-CM | POA: Diagnosis present

## 2016-03-05 DIAGNOSIS — S32020A Wedge compression fracture of second lumbar vertebra, initial encounter for closed fracture: Secondary | ICD-10-CM | POA: Diagnosis not present

## 2016-03-05 DIAGNOSIS — I509 Heart failure, unspecified: Secondary | ICD-10-CM | POA: Diagnosis not present

## 2016-03-05 DIAGNOSIS — E871 Hypo-osmolality and hyponatremia: Secondary | ICD-10-CM | POA: Diagnosis not present

## 2016-03-05 DIAGNOSIS — R918 Other nonspecific abnormal finding of lung field: Secondary | ICD-10-CM | POA: Diagnosis not present

## 2016-03-05 DIAGNOSIS — Z7901 Long term (current) use of anticoagulants: Secondary | ICD-10-CM | POA: Diagnosis not present

## 2016-03-05 DIAGNOSIS — M5137 Other intervertebral disc degeneration, lumbosacral region: Secondary | ICD-10-CM | POA: Insufficient documentation

## 2016-03-05 DIAGNOSIS — Z91041 Radiographic dye allergy status: Secondary | ICD-10-CM | POA: Insufficient documentation

## 2016-03-05 DIAGNOSIS — Z881 Allergy status to other antibiotic agents status: Secondary | ICD-10-CM | POA: Diagnosis not present

## 2016-03-05 DIAGNOSIS — Z880 Allergy status to penicillin: Secondary | ICD-10-CM | POA: Diagnosis not present

## 2016-03-05 DIAGNOSIS — E785 Hyperlipidemia, unspecified: Secondary | ICD-10-CM | POA: Diagnosis not present

## 2016-03-05 DIAGNOSIS — M47814 Spondylosis without myelopathy or radiculopathy, thoracic region: Secondary | ICD-10-CM | POA: Diagnosis not present

## 2016-03-05 DIAGNOSIS — T148 Other injury of unspecified body region: Secondary | ICD-10-CM | POA: Diagnosis present

## 2016-03-05 DIAGNOSIS — W19XXXA Unspecified fall, initial encounter: Secondary | ICD-10-CM | POA: Diagnosis not present

## 2016-03-05 LAB — BASIC METABOLIC PANEL
Anion gap: 8 (ref 5–15)
BUN: 23 mg/dL — AB (ref 6–20)
CHLORIDE: 94 mmol/L — AB (ref 101–111)
CO2: 25 mmol/L (ref 22–32)
CREATININE: 0.76 mg/dL (ref 0.44–1.00)
Calcium: 9 mg/dL (ref 8.9–10.3)
GFR calc Af Amer: >60 mL/min (ref >60)
GLUCOSE: 103 mg/dL — AB (ref 65–99)
POTASSIUM: 3.9 mmol/L (ref 3.5–5.1)
SODIUM: 127 mmol/L — AB (ref 135–145)

## 2016-03-05 LAB — CBC WITH DIFFERENTIAL/PLATELET
BASOS ABS: 0.1 10*3/uL (ref 0–0.1)
BASOS PCT: 2 %
EOS ABS: 0.1 10*3/uL (ref 0–0.7)
Eosinophils Relative: 2 %
HCT: 35 % (ref 35.0–47.0)
Hemoglobin: 12.1 g/dL (ref 12.0–16.0)
LYMPHS ABS: 1 10*3/uL (ref 1.0–3.6)
Lymphocytes Relative: 15 %
MCH: 31.9 pg (ref 26.0–34.0)
MCHC: 34.4 g/dL (ref 32.0–36.0)
MCV: 92.6 fL (ref 80.0–100.0)
MONO ABS: 0.5 10*3/uL (ref 0.2–0.9)
Monocytes Relative: 8 %
NEUTROS ABS: 4.9 10*3/uL (ref 1.4–6.5)
Neutrophils Relative %: 73 %
PLATELETS: 217 10*3/uL (ref 150–440)
RBC: 3.78 MIL/uL — ABNORMAL LOW (ref 3.80–5.20)
RDW: 14.8 % — ABNORMAL HIGH (ref 11.5–14.5)
WBC: 6.6 10*3/uL (ref 3.6–11.0)

## 2016-03-05 MED ORDER — ACETAMINOPHEN 500 MG PO TABS
1000.0000 mg | ORAL_TABLET | Freq: Once | ORAL | Status: AC
  Administered 2016-03-05: 1000 mg via ORAL
  Filled 2016-03-05: qty 2

## 2016-03-05 MED ORDER — HEPARIN SODIUM (PORCINE) 5000 UNIT/ML IJ SOLN
5000.0000 [IU] | Freq: Three times a day (TID) | INTRAMUSCULAR | Status: DC
Start: 2016-03-05 — End: 2016-03-05

## 2016-03-05 MED ORDER — OXYCODONE HCL 5 MG PO TABS
5.0000 mg | ORAL_TABLET | Freq: Once | ORAL | Status: AC
  Administered 2016-03-05: 5 mg via ORAL
  Filled 2016-03-05: qty 1

## 2016-03-05 NOTE — ED Notes (Signed)
Patient transported to CT 

## 2016-03-05 NOTE — ED Notes (Signed)
Patient reports walking from one room to another and felt herself falling backwards.  Patient reports pain down entire right side of neck and back. Patient reports hit her head but denies loss of consciousness.

## 2016-03-05 NOTE — ED Notes (Signed)
Returned from radiology. 

## 2016-03-05 NOTE — ED Provider Notes (Signed)
[facility] Emergency Department Provider Note        Time seen: ----------------------------------------- 9:18 PM on 03/05/2016 -----------------------------------------    I have reviewed the triage vital signs and the nursing notes.   HISTORY  Chief Complaint Fall    HPI Courtney Pruitt is a 82 y.o. female who presents ER after a fall from home. According to EMS the patient was walking and felt herself going backwards while using a walker and she fell backwards hitting her back and her head. She complains of diffuse low back pain as well as neck pain. She is denying any headache currently. Patient denies any changes in her medications, states she recently had a chest cold that would not improve. Otherwise she is denying complaints at this time.   Past Medical History  Diagnosis Date  . Hypertension   . CHF (congestive heart failure) (HCC)   . Hyperlipidemia   . Atrial fibrillation (HCC)     There are no active problems to display for this patient.   Past Surgical History  Procedure Laterality Date  . Knee surgery    . Pacemaker insertion      Allergies Ace inhibitors; Amiodarone; Codeine; Fluorescein; Iodinated diagnostic agents; Nitrofurantoin; Cinoxacin; Penicillins; and Sulfa antibiotics  Social History Social History  Substance Use Topics  . Smoking status: Never Smoker   . Smokeless tobacco: Not on file  . Alcohol Use: No    Review of Systems Constitutional: Negative for fever. Eyes: Negative for visual changes. ENT: Negative for sore throat. Cardiovascular: Negative for chest pain. Respiratory: Negative for shortness of breath. Gastrointestinal: Negative for abdominal pain, vomiting and diarrhea. Genitourinary: Negative for dysuria. Musculoskeletal:Positive for back pain, neck pain Skin: Negative for rash. Neurological: Positive for mild headache  10-point ROS otherwise  negative.  ____________________________________________   PHYSICAL EXAM:  VITAL SIGNS: ED Triage Vitals  Enc Vitals Group     BP --      Pulse --      Resp --      Temp --      Temp src --      SpO2 --      Weight --      Height --      Head Cir --      Peak Flow --      Pain Score --      Pain Loc --      Pain Edu? --      Excl. in GC? --     Constitutional: Alert and oriented. Well appearing and in no distress. Eyes: Conjunctivae are normal. PERRL. Normal extraocular movements. ENT   Head:Tenderness in the midline occipital scalp   Nose: No congestion/rhinnorhea.   Mouth/Throat: Mucous membranes are moist.   Neck: No stridor. Cardiovascular: Normal rate, regular rhythm. No murmurs, rubs, or gallops. Respiratory: Normal respiratory effort without tachypnea nor retractions. Breath sounds are clear and equal bilaterally. No wheezes/rales/rhonchi. Gastrointestinal: Soft and nontender. Normal bowel sounds Musculoskeletal: Nontender with normal range of motion in all extremities. No lower extremity tenderness nor edema. Thoracic and lumbar sacral spine tenderness Neurologic:  Normal speech and language. No gross focal neurologic deficits are appreciated.  Skin:  Skin is warm, dry and intact. No rash noted. Psychiatric: Mood and affect are normal. Speech and behavior are normal.  ____________________________________________  ED COURSE:  Pertinent labs & imaging results that were available during my care of the patient were reviewed by me and considered in my medical decision making (  see chart for details). Patient presents to ER after falling backwards. I will obtain basic x-rays and reevaluate.  EKG: Interpreted by me, normal sinus rhythm with a rate of 59 bpm, normal PR interval, normal QRS, normal QT interval. Likely LVH. ____________________________________________  Labs Reviewed  CBC WITH DIFFERENTIAL/PLATELET  BASIC METABOLIC PANEL      RADIOLOGY Images were viewed by me  CT head, C-spine, thoracic and lumbar sacral spine x-rays IMPRESSION: No acute finding at the thoracic levels. IMPRESSION: 1. Age-indeterminate L2 compression fracture with ~50% height loss. Negative for retropulsion. 2. Remote L3 and L4 compression fractures. IMPRESSION: 1. No evidence of traumatic intracranial injury or fracture. 2. No evidence of fracture or subluxation along the cervical spine. 3. Mild to moderate cortical volume loss and scattered small vessel ischemic microangiopathy. 4. Mild degenerative change along the cervical spine. 5. Heterogeneous 3.4 cm mass at the right thyroid lobe, with minimal calcification. Recommend further evaluation with thyroid ultrasound. If patient is clinically hyperthyroid, consider nuclear medicine thyroid uptake and scan. 6. Small right-sided pleural effusion noted. Mild interstitial prominence at the lung apices. 7. Prominent calcification at the carotid bifurcations bilaterally. Carotid ultrasound would be helpful for further evaluation, when and as deemed clinically appropriate. ____________________________________________  FINAL ASSESSMENT AND PLAN  Fall, minor head injury, thoracic strain, lumbar compression fracture  Plan: Patient with imaging as dictated above. Patient with age-indeterminate lumbar compression fracture who is having severe low back pain to the point where she cannot walk. Patient will require basic labs and admission for physical therapy and pain control.   Emily FilbertWilliams, Jonathan E, MD   Note: This dictation was prepared with Dragon dictation. Any transcriptional errors that result from this process are unintentional   Emily FilbertJonathan E Williams, MD 03/05/16 2231

## 2016-03-05 NOTE — ED Notes (Signed)
Gerilyn PilgrimJacob, EDT attempted to ambulate patient with walker and 2nd person assist.  Patient stated she was unable to get up due to pain.

## 2016-03-05 NOTE — ED Notes (Signed)
Patient to room 4 via EMS from home after a fall.  Per EMS patient was walking and felt herself going backwards and fell, hit head (denies loss of consciousness) landed on back.  Patient complains of pain to leg and down length of her back.

## 2016-03-06 ENCOUNTER — Encounter: Payer: Self-pay | Admitting: Internal Medicine

## 2016-03-06 DIAGNOSIS — R339 Retention of urine, unspecified: Secondary | ICD-10-CM | POA: Insufficient documentation

## 2016-03-06 LAB — TSH: TSH: 1.848 u[IU]/mL (ref 0.350–4.500)

## 2016-03-06 LAB — PROTIME-INR
INR: 1.62
Prothrombin Time: 19.3 seconds — ABNORMAL HIGH (ref 11.4–15.0)

## 2016-03-06 LAB — HEMOGLOBIN A1C: Hgb A1c MFr Bld: 5.2 % (ref 4.0–6.0)

## 2016-03-06 MED ORDER — WARFARIN SODIUM 3 MG PO TABS: 3.0000 mg | ORAL_TABLET | Freq: Every day | ORAL | Status: DC

## 2016-03-06 MED ORDER — WARFARIN - PHARMACIST DOSING INPATIENT
Freq: Every day | Status: DC
  Administered 2016-03-07: 17:00:00

## 2016-03-06 MED ORDER — VITAMIN C 500 MG PO TABS
500.0000 mg | ORAL_TABLET | Freq: Every day | ORAL | Status: DC
  Administered 2016-03-06 – 2016-03-08 (×3): 500 mg via ORAL
  Filled 2016-03-06 (×3): qty 1

## 2016-03-06 MED ORDER — MORPHINE SULFATE (PF) 2 MG/ML IV SOLN
2.0000 mg | INTRAVENOUS | Status: DC | PRN
  Administered 2016-03-06 (×2): 2 mg via INTRAVENOUS
  Filled 2016-03-06 (×2): qty 1

## 2016-03-06 MED ORDER — FUROSEMIDE 40 MG PO TABS
80.0000 mg | ORAL_TABLET | Freq: Every day | ORAL | Status: DC
  Administered 2016-03-06 – 2016-03-08 (×3): 80 mg via ORAL
  Filled 2016-03-06 (×3): qty 2

## 2016-03-06 MED ORDER — ACETAMINOPHEN 500 MG PO TABS
1000.0000 mg | ORAL_TABLET | ORAL | Status: DC | PRN
  Administered 2016-03-06 – 2016-03-08 (×5): 1000 mg via ORAL
  Filled 2016-03-06 (×6): qty 2

## 2016-03-06 MED ORDER — METOPROLOL TARTRATE 25 MG PO TABS: 12.5000 mg | ORAL_TABLET | Freq: Two times a day (BID) | ORAL | Status: DC

## 2016-03-06 MED ORDER — CRANBERRY 500 MG PO CAPS: 500.0000 mg | ORAL_CAPSULE | Freq: Two times a day (BID) | ORAL | Status: DC

## 2016-03-06 MED ORDER — ONDANSETRON HCL 4 MG PO TABS: 4.0000 mg | ORAL_TABLET | Freq: Four times a day (QID) | ORAL | Status: DC | PRN

## 2016-03-06 MED ORDER — LORATADINE 10 MG PO TABS
10.0000 mg | ORAL_TABLET | Freq: Every day | ORAL | Status: DC
  Administered 2016-03-06 – 2016-03-08 (×3): 10 mg via ORAL
  Filled 2016-03-06 (×3): qty 1

## 2016-03-06 MED ORDER — DOCUSATE SODIUM 100 MG PO CAPS
100.0000 mg | ORAL_CAPSULE | Freq: Two times a day (BID) | ORAL | Status: DC
  Administered 2016-03-06 – 2016-03-08 (×6): 100 mg via ORAL
  Filled 2016-03-06 (×6): qty 1

## 2016-03-06 MED ORDER — ONDANSETRON HCL 4 MG/2ML IJ SOLN
4.0000 mg | Freq: Four times a day (QID) | INTRAMUSCULAR | Status: DC | PRN
  Administered 2016-03-06: 4 mg via INTRAVENOUS
  Filled 2016-03-06: qty 2

## 2016-03-06 MED ORDER — ADULT MULTIVITAMIN W/MINERALS CH
1.0000 | ORAL_TABLET | Freq: Every day | ORAL | Status: DC
  Administered 2016-03-06 – 2016-03-07 (×2): 1 via ORAL
  Filled 2016-03-06 (×2): qty 1

## 2016-03-06 MED ORDER — SOTALOL HCL 80 MG PO TABS
80.0000 mg | ORAL_TABLET | Freq: Every day | ORAL | Status: DC
  Administered 2016-03-06 – 2016-03-08 (×3): 80 mg via ORAL
  Filled 2016-03-06 (×3): qty 1

## 2016-03-06 MED ORDER — METOPROLOL TARTRATE 25 MG PO TABS
25.0000 mg | ORAL_TABLET | Freq: Every day | ORAL | Status: DC
  Administered 2016-03-06 – 2016-03-08 (×3): 25 mg via ORAL
  Filled 2016-03-06 (×3): qty 1

## 2016-03-06 MED ORDER — SIMVASTATIN 10 MG PO TABS
10.0000 mg | ORAL_TABLET | Freq: Every day | ORAL | Status: DC
  Administered 2016-03-06 – 2016-03-07 (×3): 10 mg via ORAL
  Filled 2016-03-06 (×3): qty 1

## 2016-03-06 MED ORDER — METOPROLOL TARTRATE 25 MG PO TABS
12.5000 mg | ORAL_TABLET | Freq: Every day | ORAL | Status: DC
  Administered 2016-03-06 – 2016-03-07 (×3): 12.5 mg via ORAL
  Filled 2016-03-06 (×3): qty 1

## 2016-03-06 MED ORDER — CALCIUM CARBONATE ANTACID 500 MG PO CHEW
1250.0000 mg | CHEWABLE_TABLET | Freq: Two times a day (BID) | ORAL | Status: DC
  Administered 2016-03-06 – 2016-03-08 (×5): 1250 mg via ORAL
  Filled 2016-03-06 (×5): qty 3

## 2016-03-06 MED ORDER — POTASSIUM CHLORIDE CRYS ER 20 MEQ PO TBCR
20.0000 meq | EXTENDED_RELEASE_TABLET | Freq: Every day | ORAL | Status: DC
  Administered 2016-03-06 – 2016-03-08 (×3): 20 meq via ORAL
  Filled 2016-03-06 (×3): qty 1

## 2016-03-06 MED ORDER — WARFARIN SODIUM 5 MG PO TABS
5.0000 mg | ORAL_TABLET | Freq: Once | ORAL | Status: AC
  Administered 2016-03-06: 5 mg via ORAL
  Filled 2016-03-06: qty 1

## 2016-03-06 MED ORDER — WARFARIN SODIUM 3 MG PO TABS
3.5000 mg | ORAL_TABLET | Freq: Every day | ORAL | Status: DC
  Administered 2016-03-06: 3.5 mg via ORAL
  Filled 2016-03-06: qty 1

## 2016-03-06 MED ORDER — ALBUTEROL SULFATE (2.5 MG/3ML) 0.083% IN NEBU: 3.0000 mL | INHALATION_SOLUTION | Freq: Four times a day (QID) | RESPIRATORY_TRACT | Status: DC | PRN

## 2016-03-06 NOTE — ED Notes (Signed)
Pt transported to room 142 

## 2016-03-06 NOTE — Progress Notes (Signed)
ANTICOAGULATION CONSULT NOTE - Initial Consult  Pharmacy Consult for warfarin Indication: atrial fibrillation  Allergies  Allergen Reactions  . Ace Inhibitors Other (See Comments)    Reaction: Unknown  . Amiodarone Other (See Comments)    Reaction:  Pt has difficulty balancing   . Clindamycin/Lincomycin Other (See Comments)    Reaction:  Difficulty swallowing   . Codeine Other (See Comments)    Reaction: GI upset   . Fluorescein Itching and Other (See Comments)    Reaction:  Throat tightness    . Iodinated Diagnostic Agents Itching and Other (See Comments)    Reaction:  Throat tightness    . Lovastatin Other (See Comments)    Reaction:  Muscle pain   . Nifedipine Other (See Comments)    Reaction:  Unknown   . Cefpodoxime Rash  . Cinoxacin Rash  . Ciprofloxacin Rash  . Doxazosin Rash  . Indapamide Rash  . Nitrofurantoin Rash  . Penicillins Rash and Other (See Comments)    Has patient had a PCN reaction causing immediate rash, facial/tongue/throat swelling, SOB or lightheadedness with hypotension: No Has patient had a PCN reaction causing severe rash involving mucus membranes or skin necrosis: No Has patient had a PCN reaction that required hospitalization No Has patient had a PCN reaction occurring within the last 10 years: No If all of the above answers are "NO", then may proceed with Cephalosporin use.  . Quinine Derivatives Rash  . Sulfa Antibiotics Rash  . Telithromycin Rash    Patient Measurements: Height: 5\' 2"  (157.5 cm) Weight: 136 lb 14.4 oz (62.097 kg) IBW/kg (Calculated) : 50.1 Heparin Dosing Weight:   Vital Signs: Temp: 97.5 F (36.4 C) (04/30 0102) Temp Source: Oral (04/30 0102) BP: 135/57 mmHg (04/30 0102) Pulse Rate: 69 (04/30 0102)  Labs:  Recent Labs  03/05/16 2301  HGB 12.1  HCT 35.0  PLT 217  LABPROT 19.3*  INR 1.62  CREATININE 0.76    Estimated Creatinine Clearance: 42.9 mL/min (by C-G formula based on Cr of 0.76).   Medical  History: Past Medical History  Diagnosis Date  . Hypertension   . CHF (congestive heart failure) (HCC)   . Hyperlipidemia   . Atrial fibrillation (HCC)     Medications:  Infusions:    Assessment: 89 yof cc fall. Patient reports hitting her head. Takes VKA for AF, INR 1.62, subtherapeutic. No evidence of hemorrhage on CT. Pharmacy consulted for warfarin dosing.  Goal of Therapy:  INR 2-3 Monitor platelets by anticoagulation protocol: Yes   Plan:  Patient takes warfarin 2.5 mg po daily Monday through Friday, and 5 mg po on Saturday and Sunday. Will give 5 mg x 1 now and increase to 3.5 mg po daily starting this evening. Pharmacy will continue to follow and adjust as needed to maintain INR 2 to 3.   Courtney FrostNathan A Rashon Pruitt, Pharm.D., BCPS Clinical Pharmacist 03/06/2016,2:31 AM

## 2016-03-06 NOTE — Care Management Obs Status (Signed)
MEDICARE OBSERVATION STATUS NOTIFICATION   Patient Details  Name: Courtney Pruitt MRN: [id number] Date of Birth: April 24, 1926   Medicare Observation Status Notification Given:  Yes  Courtney Pruitt is sedated. Discussed with her son Francis DowseJoel and provided him with a copy of the MOON.     Jaretzi Droz A, RN 03/06/2016, 3:37 PM

## 2016-03-06 NOTE — Progress Notes (Signed)
New Admission Note:   Arrival Method: per bed from ED Mental Orientation: alert and oriented X4 Telemetry: none ordered Assessment: Completed Skin: warm, dry, intact, no wounds, bilateral lower extremity edema noted. IV: G22 on the right forearm with transparent dressing, saline locked Pain: scale 6/10 on the lower back, pt offered pain medicine but stated she "had one" at the ED Tubes: O2 inhalation at 2Lpm via nasal cannula Safety Measures: Safety Fall Prevention Plan has been given and discussed Admission: Completed Unit Orientation: Patient has been orientated to the room, unit and staff.  Family: children at bedside  Orders have been reviewed and implemented. Will continue to monitor the patient. Call light has been placed within reach and bed alarm has been activated.   Janice NorrieAnessa Roma Bondar BSN, RN [facility]

## 2016-03-06 NOTE — Care Management Obs Status (Signed)
MEDICARE OBSERVATION STATUS NOTIFICATION   Patient Details  Name: Courtney Pruitt MRN: [id number] Date of Birth: [birth year]   Medicare Observation Status Notification Given:  Yes    CrutchfieldDerrill Memo, Shirley Bolle M, RN 03/06/2016, 7:02 PM

## 2016-03-06 NOTE — H&P (Signed)
Courtney Pruitt is an 82 y.o. female.   Chief Complaint: Fall HPI: The patient presents emergency department after suffering a fall. She fell backward and landed on her buttocks. She denies hitting her head or loss of consciousness. Past medical history significant for atrial fibrillation and hypertension. In the emergency department the patient was found to have a compression fracture of her L2 vertebra. Due to her excruciating pain and inability to transfer or ambulate without difficulty emergency department staff called for admission.  Past Medical History  Diagnosis Date  . Hypertension   . CHF (congestive heart failure) ([place])   . Hyperlipidemia   . Atrial fibrillation [facility])     Past Surgical History  Procedure Laterality Date  . Knee surgery    . Pacemaker insertion    . Thyroidectomy      Family History  Problem Relation Age of Onset  . Cancer Mother     bladder cancer   . Cancer Father     lung cancer   Social History:  reports that she has never smoked. She does not have any smokeless tobacco history on file. She reports that she does not drink alcohol or use illicit drugs.  Allergies:  Allergies  Allergen Reactions  . Ace Inhibitors Other (See Comments)    Reaction: Unknown  . Amiodarone Other (See Comments)    Reaction:  Pt has difficulty balancing   . Clindamycin/Lincomycin Other (See Comments)    Reaction:  Difficulty swallowing   . Codeine Other (See Comments)    Reaction: GI upset   . Fluorescein Itching and Other (See Comments)    Reaction:  Throat tightness    . Iodinated Diagnostic Agents Itching and Other (See Comments)    Reaction:  Throat tightness    . Lovastatin Other (See Comments)    Reaction:  Muscle pain   . Nifedipine Other (See Comments)    Reaction:  Unknown   . Cefpodoxime Rash  . Cinoxacin Rash  . Ciprofloxacin Rash  . Doxazosin Rash  . Indapamide Rash  . Nitrofurantoin Rash  . Penicillins Rash and Other (See Comments)    Has patient  had a PCN reaction causing immediate rash, facial/tongue/throat swelling, SOB or lightheadedness with hypotension: No Has patient had a PCN reaction causing severe rash involving mucus membranes or skin necrosis: No Has patient had a PCN reaction that required hospitalization No Has patient had a PCN reaction occurring within the last 10 years: No If all of the above answers are "NO", then may proceed with Cephalosporin use.  . Quinine Derivatives Rash  . Sulfa Antibiotics Rash  . Telithromycin Rash    Medications Prior to Admission  Medication Sig Dispense Refill  . acetaminophen (TYLENOL) 500 MG tablet Take 1,000 mg by mouth every 4 (four) hours as needed for mild pain or headache.     . albuterol (PROVENTIL HFA;VENTOLIN HFA) 108 (90 BASE) MCG/ACT inhaler Inhale 2 puffs into the lungs every 6 (six) hours as needed for wheezing or shortness of breath. 1 Inhaler 2  . calcium carbonate 1250 MG capsule Take 1,250 mg by mouth 2 (two) times daily with a meal.    . cefUROXime (CEFTIN) 250 MG tablet Take 250 mg by mouth 2 (two) times daily with a meal.    . cetirizine (ZYRTEC) 10 MG tablet Take 10 mg by mouth daily.    . Cranberry 500 MG CAPS Take 500 mg by mouth 2 (two) times daily.     . furosemide (LASIX)  40 MG tablet Take 80 mg by mouth daily.    . metoprolol tartrate (LOPRESSOR) 25 MG tablet Take 12.5-25 mg by mouth 2 (two) times daily. Pt takes one tablet in the morning and one-half tablet at night.    . Multiple Vitamin (MULTIVITAMIN WITH MINERALS) TABS tablet Take 1 tablet by mouth daily.    . potassium chloride (K-DUR,KLOR-CON) 10 MEQ tablet Take 20 mEq by mouth daily.     . psyllium (METAMUCIL) 58.6 % packet Take 1 packet by mouth at bedtime.    . simvastatin (ZOCOR) 10 MG tablet Take 10 mg by mouth at bedtime.    . sotalol (BETAPACE) 80 MG tablet Take 80 mg by mouth daily.    . valACYclovir (VALTREX) 1000 MG tablet Take 1,000 mg by mouth 2 (two) times daily.    . vitamin C (ASCORBIC  ACID) 500 MG tablet Take 500 mg by mouth daily.    Marland Kitchen warfarin (COUMADIN) 2.5 MG tablet Take 2.5 mg by mouth at bedtime. Pt takes this dose Monday-Friday.    . warfarin (COUMADIN) 5 MG tablet Take 5 mg by mouth 2 (two) times a week. Pt takes this dose on Saturday and Sunday.      Results for orders placed or performed during the hospital encounter of 03/05/16 (from the past 48 hour(s))  CBC with Differential/Platelet     Status: Abnormal   Collection Time: 03/05/16 11:01 PM  Result Value Ref Range   WBC 6.6 3.6 - 11.0 K/uL    Comment: WBC COUNT PERFORMED ON CITRATED BLOOD   RBC 3.78 (L) 3.80 - 5.20 MIL/uL   Hemoglobin 12.1 12.0 - 16.0 g/dL   HCT 35.0 35.0 - 47.0 %   MCV 92.6 80.0 - 100.0 fL   MCH 31.9 26.0 - 34.0 pg   MCHC 34.4 32.0 - 36.0 g/dL   RDW 14.8 (H) 11.5 - 14.5 %   Platelets 217 150 - 440 K/uL    Comment: PLATELET COUNT PERFORMED ON CITRATED BLOOD   Neutrophils Relative % 73 %   Lymphocytes Relative 15 %   Monocytes Relative 8 %   Eosinophils Relative 2 %   Basophils Relative 2 %   Neutro Abs 4.9 1.4 - 6.5 K/uL   Lymphs Abs 1.0 1.0 - 3.6 K/uL   Monocytes Absolute 0.5 0.2 - 0.9 K/uL   Eosinophils Absolute 0.1 0 - 0.7 K/uL   Basophils Absolute 0.1 0 - 0.1 K/uL   RBC Morphology MIXED RBC POPULATION    Smear Review LARGE PLATELETS PRESENT   Basic metabolic panel     Status: Abnormal   Collection Time: 03/05/16 11:01 PM  Result Value Ref Range   Sodium 127 (L) 135 - 145 mmol/L   Potassium 3.9 3.5 - 5.1 mmol/L    Comment: HEMOLYSIS AT THIS LEVEL MAY AFFECT RESULT   Chloride 94 (L) 101 - 111 mmol/L   CO2 25 22 - 32 mmol/L   Glucose, Bld 103 (H) 65 - 99 mg/dL   BUN 23 (H) 6 - 20 mg/dL   Creatinine, Ser 0.76 0.44 - 1.00 mg/dL   Calcium 9.0 8.9 - 10.3 mg/dL   GFR calc non Af Amer >60 >60 mL/min   GFR calc Af Amer >60 >60 mL/min    Comment: (NOTE) The eGFR has been calculated using the CKD EPI equation. This calculation has not been validated in all clinical  situations. eGFR's persistently <60 mL/min signify possible Chronic Kidney Disease.    Anion gap 8 5 -  15  Protime-INR     Status: Abnormal   Collection Time: 03/05/16 11:01 PM  Result Value Ref Range   Prothrombin Time 19.3 (H) 11.4 - 15.0 seconds   INR 1.62    Dg Thoracic Spine 2 View  03/05/2016  CLINICAL DATA:  Fall with back pain.  Initial encounter. EXAM: THORACIC SPINE 2 VIEWS COMPARISON:  04/17/2015 chest x-ray FINDINGS: There is no evidence of thoracic spine acute fracture or subluxation. Exaggerated thoracic kyphosis and bulky lower thoracic spondylosis. Lumbar findings reported separately. Chronic bilateral interstitial coarsening.  Biventricular ICD/pacer. IMPRESSION: No acute finding at the thoracic levels. Electronically Signed   By: Monte Fantasia M.D.   On: 03/05/2016 22:07   Dg Lumbar Spine 2-3 Views  03/05/2016  CLINICAL DATA:  Fall with lower back pain. Initial encounter. EXAM: LUMBAR SPINE - 2-3 VIEW COMPARISON:  Abdominal CT 03/21/2013 FINDINGS: L2 body fracture with superior endplate compression causing ~50% height loss at maximum. Fracture age is indeterminate. No retropulsion or subluxation. L3 superior endplate and L4 inferior endplate compression fractures are chronic and stable from prior. No evidence of traumatic malalignment. Chronic L4-5 mild anterolisthesis associated with facet arthropathy. Advanced L5-S1 degenerative disc narrowing. Upper lumbar spondylotic endplate spurring. Osteopenia. IMPRESSION: 1. Age-indeterminate L2 compression fracture with ~50% height loss. Negative for retropulsion. 2. Remote L3 and L4 compression fractures. Electronically Signed   By: Monte Fantasia M.D.   On: 03/05/2016 22:10   Ct Head Wo Contrast  03/05/2016  CLINICAL DATA:  [place] backwards while walking. Hit head, and landed on back. Concern for cervical spine injury. Initial encounter. EXAM: CT HEAD WITHOUT CONTRAST CT CERVICAL SPINE WITHOUT CONTRAST TECHNIQUE: Multidetector CT  imaging of the head and cervical spine was performed following the standard protocol without intravenous contrast. Multiplanar CT image reconstructions of the cervical spine were also generated. COMPARISON:  CT of the head performed 07/27/2012 FINDINGS: CT HEAD FINDINGS There is no evidence of acute infarction, mass lesion, or intra- or extra-axial hemorrhage on CT. Prominence of the ventricles and sulci reflects mild to moderate cortical volume loss. Mild cerebellar atrophy is noted. Mild periventricular white matter change likely reflects small ischemic microangiopathy. The brainstem and fourth ventricle are within normal limits. The basal ganglia are unremarkable in appearance. The cerebral hemispheres demonstrate grossly normal gray-white differentiation. No mass effect or midline shift is seen. There is no evidence of fracture; visualized osseous structures are unremarkable in appearance. The visualized portions of the orbits are within normal limits. The paranasal sinuses and mastoid air cells are well-aerated. No significant soft tissue abnormalities are seen. CT CERVICAL SPINE FINDINGS There is no evidence of acute fracture or subluxation. There is mild grade 1 anterolisthesis of C3 on C4, and multilevel disc space narrowing along the cervical spine, with scattered anterior and posterior disc osteophyte complexes. Underlying facet disease is noted. Vertebral bodies demonstrate normal height. Prevertebral soft tissues are within normal limits. A heterogeneous 3.4 cm mass is noted at the right thyroid lobe, with minimal calcification. A small right-sided pleural effusion is noted. Mild interstitial prominence is noted at the lung apices. Prominent calcification is seen at the carotid bifurcations bilaterally. IMPRESSION: 1. No evidence of traumatic intracranial injury or fracture. 2. No evidence of fracture or subluxation along the cervical spine. 3. Mild to moderate cortical volume loss and scattered small  vessel ischemic microangiopathy. 4. Mild degenerative change along the cervical spine. 5. Heterogeneous 3.4 cm mass at the right thyroid lobe, with minimal calcification. Recommend further evaluation with thyroid  ultrasound. If patient is clinically hyperthyroid, consider nuclear medicine thyroid uptake and scan. 6. Small right-sided pleural effusion noted. Mild interstitial prominence at the lung apices. 7. Prominent calcification at the carotid bifurcations bilaterally. Carotid ultrasound would be helpful for further evaluation, when and as deemed clinically appropriate. Electronically Signed   By: Garald Balding M.D.   On: 03/05/2016 21:59   Ct Cervical Spine Wo Contrast  03/05/2016  CLINICAL DATA:  [place] backwards while walking. Hit head, and landed on back. Concern for cervical spine injury. Initial encounter. EXAM: CT HEAD WITHOUT CONTRAST CT CERVICAL SPINE WITHOUT CONTRAST TECHNIQUE: Multidetector CT imaging of the head and cervical spine was performed following the standard protocol without intravenous contrast. Multiplanar CT image reconstructions of the cervical spine were also generated. COMPARISON:  CT of the head performed 07/27/2012 FINDINGS: CT HEAD FINDINGS There is no evidence of acute infarction, mass lesion, or intra- or extra-axial hemorrhage on CT. Prominence of the ventricles and sulci reflects mild to moderate cortical volume loss. Mild cerebellar atrophy is noted. Mild periventricular white matter change likely reflects small ischemic microangiopathy. The brainstem and fourth ventricle are within normal limits. The basal ganglia are unremarkable in appearance. The cerebral hemispheres demonstrate grossly normal gray-white differentiation. No mass effect or midline shift is seen. There is no evidence of fracture; visualized osseous structures are unremarkable in appearance. The visualized portions of the orbits are within normal limits. The paranasal sinuses and mastoid air cells are  well-aerated. No significant soft tissue abnormalities are seen. CT CERVICAL SPINE FINDINGS There is no evidence of acute fracture or subluxation. There is mild grade 1 anterolisthesis of C3 on C4, and multilevel disc space narrowing along the cervical spine, with scattered anterior and posterior disc osteophyte complexes. Underlying facet disease is noted. Vertebral bodies demonstrate normal height. Prevertebral soft tissues are within normal limits. A heterogeneous 3.4 cm mass is noted at the right thyroid lobe, with minimal calcification. A small right-sided pleural effusion is noted. Mild interstitial prominence is noted at the lung apices. Prominent calcification is seen at the carotid bifurcations bilaterally. IMPRESSION: 1. No evidence of traumatic intracranial injury or fracture. 2. No evidence of fracture or subluxation along the cervical spine. 3. Mild to moderate cortical volume loss and scattered small vessel ischemic microangiopathy. 4. Mild degenerative change along the cervical spine. 5. Heterogeneous 3.4 cm mass at the right thyroid lobe, with minimal calcification. Recommend further evaluation with thyroid ultrasound. If patient is clinically hyperthyroid, consider nuclear medicine thyroid uptake and scan. 6. Small right-sided pleural effusion noted. Mild interstitial prominence at the lung apices. 7. Prominent calcification at the carotid bifurcations bilaterally. Carotid ultrasound would be helpful for further evaluation, when and as deemed clinically appropriate. Electronically Signed   By: Garald Balding M.D.   On: 03/05/2016 21:59   Ct Lumbar Spine Wo Contrast  03/05/2016  CLINICAL DATA:  Status post fall, with lower back pain. Initial encounter. EXAM: CT LUMBAR SPINE WITHOUT CONTRAST TECHNIQUE: Multidetector CT imaging of the lumbar spine was performed without intravenous contrast administration. Multiplanar CT image reconstructions were also generated. COMPARISON:  CT of the abdomen and  pelvis performed 03/21/2013, and lumbar spine radiographs performed earlier today at 9:34 p.m. FINDINGS: There is a compression deformity involving vertebral body L2, with nearly 70% loss of height. This is new from 2014, though of indeterminate age. Associated fractures are noted through the anterior osteophytes at the inferior endplate of L1 and superior endplate of L2. There is no evidence of extension through  the posterior elements. There is underlying chronic compression deformity involving the superior endplate of L3 and inferior endplate of L4, relatively stable from 2014, and mild sclerotic change at the inferior endplate of L1. Underlying facet disease is noted along the lumbar spine. Vacuum phenomenon is noted along the lower lumbar spine. A small to moderate right-sided pleural effusion is noted. Diffuse calcification is seen along the abdominal aorta and its branches. Mild atrophy is noted with regard to the paraspinal musculature. The visualized soft tissues are otherwise grossly unremarkable. IMPRESSION: 1. New compression deformity involving vertebral body L2, with nearly 70% loss of height, of indeterminate age. Associated fractures through the anterior osteophytes at the inferior endplate of L1 and superior endplate of L2. No evidence of extension through the posterior elements. 2. Chronic compression deformity involving the superior endplate of L3 and inferior endplate of L4, relatively stable from 2014. Mild degenerative change along the lumbar spine. 3. Small to moderate right-sided pleural effusion. 4. Diffuse calcification along the abdominal aorta and its branches. Electronically Signed   By: Garald Balding M.D.   On: 03/05/2016 23:26    Review of Systems  Constitutional: Negative for fever and chills.  HENT: Negative for sore throat and tinnitus.   Eyes: Negative for blurred vision and redness.  Respiratory: Negative for cough and shortness of breath.   Cardiovascular: Negative for  chest pain, palpitations, orthopnea and PND.  Gastrointestinal: Negative for nausea, vomiting, abdominal pain and diarrhea.  Genitourinary: Negative for dysuria, urgency and frequency.  Musculoskeletal: Positive for back pain. Negative for myalgias and joint pain.  Skin: Negative for rash.       No lesions  Neurological: Negative for speech change, focal weakness and weakness.  Endo/Heme/Allergies: Does not bruise/bleed easily.       No temperature intolerance  Psychiatric/Behavioral: Negative for depression and suicidal ideas.    Blood pressure 135/57, pulse 69, temperature 97.5 F (36.4 C), temperature source Oral, resp. rate 16, height 5' 2"  (1.575 m), weight 62.097 kg (136 lb 14.4 oz), SpO2 99 %. Physical Exam  Vitals reviewed. Constitutional: She is oriented to person, place, and time. She appears well-developed and well-nourished. No distress.  HENT:  Head: Normocephalic and atraumatic.  Mouth/Throat: Oropharynx is clear and moist.  Eyes: Conjunctivae and EOM are normal. Pupils are equal, round, and reactive to light. No scleral icterus.  Neck: Normal range of motion. Neck supple. No JVD present. No tracheal deviation present. No thyromegaly present.  Cardiovascular: Normal rate, regular rhythm and normal heart sounds.  Exam reveals no gallop and no friction rub.   No murmur heard. Respiratory: Effort normal and breath sounds normal.  GI: Soft. Bowel sounds are normal. She exhibits no distension. There is no tenderness.  Genitourinary:  Deferred  Musculoskeletal: Normal range of motion. She exhibits no edema.  Lymphadenopathy:    She has no cervical adenopathy.  Neurological: She is alert and oriented to person, place, and time. No cranial nerve deficit. She exhibits normal muscle tone.  Skin: Skin is warm and dry.  Psychiatric: She has a normal mood and affect. Her behavior is normal. Judgment and thought content normal.     Assessment/Plan This is an 82 year old female  admitted for intractable pain 1. Intractable pain: Secondary to vertebral compression fracture. We will manage patient's pain is best we can and initiate physical therapy as soon as possible. 2. Compression fracture: Acute L2 fracture; multiple chronic fractures. No spinal impingement. The patient has no neurologic deficits. 3. Essential hypertension: Continue  metoprolol 4. Atrial fibrillation: Rate controlled; continue sotalol as well as warfarin 5. Osteoporosis: Continue calcium and vitamin D supplementation 6. DVT prophylaxis: As above 7. GI prophylaxis: None The patient is a full code. Time spent on admission orders and patient care approximately 45 minutes  Harrie Foreman, MD 03/06/2016, 2:15 AM

## 2016-03-06 NOTE — ED Notes (Signed)
Patient resting quietly at this time.  Awaiting admission.

## 2016-03-06 NOTE — Consult Note (Signed)
ORTHOPAEDIC CONSULTATION  REQUESTING PHYSICIAN: Enedina Finner, MD  Chief Complaint: Neck and upper back pain.  Some low back pain  HPI: Courtney Pruitt is a 82 y.o. female who complains of  neck and upper back pain and low back pain following a fall at home yesterday.  She was brought to the emergency room where exam and x-rays revealed no fractures in the upper upper thoracic to lower cervical region.  There were several old thoracolumbar fractures and at L2 .  There was a fracture of indeterminate age.  New since 2014.  She was admitted for pain control and she was not able to be ambulated.  She is doing much better today.  She complains only of neck and upper back pain.  She does not complain of lower back pain.  Past Medical History  Diagnosis Date  . Hypertension   . CHF (congestive heart failure) (HCC)   . Hyperlipidemia   . Atrial fibrillation [facility])    Past Surgical History  Procedure Laterality Date  . Knee surgery    . Pacemaker insertion    . Thyroidectomy     Social History   Social History  . Marital Status: Married    Spouse Name: N/A  . Number of Children: N/A  . Years of Education: N/A   Social History Main Topics  . Smoking status: Never Smoker   . Smokeless tobacco: None  . Alcohol Use: No  . Drug Use: No  . Sexual Activity: Not Asked   Other Topics Concern  . None   Social History Narrative   Family History  Problem Relation Age of Onset  . Cancer Mother     bladder cancer   . Cancer Father     lung cancer   Allergies  Allergen Reactions  . Ace Inhibitors Other (See Comments)    Reaction: Unknown  . Amiodarone Other (See Comments)    Reaction:  Pt has difficulty balancing   . Clindamycin/Lincomycin Other (See Comments)    Reaction:  Difficulty swallowing   . Codeine Other (See Comments)    Reaction: GI upset   . Fluorescein Itching and Other (See Comments)    Reaction:  Throat tightness    . Iodinated Diagnostic Agents Itching and Other (See  Comments)    Reaction:  Throat tightness    . Lovastatin Other (See Comments)    Reaction:  Muscle pain   . Nifedipine Other (See Comments)    Reaction:  Unknown   . Cefpodoxime Rash  . Cinoxacin Rash  . Ciprofloxacin Rash  . Doxazosin Rash  . Indapamide Rash  . Nitrofurantoin Rash  . Penicillins Rash and Other (See Comments)    Has patient had a PCN reaction causing immediate rash, facial/tongue/throat swelling, SOB or lightheadedness with hypotension: No Has patient had a PCN reaction causing severe rash involving mucus membranes or skin necrosis: No Has patient had a PCN reaction that required hospitalization No Has patient had a PCN reaction occurring within the last 10 years: No If all of the above answers are "NO", then may proceed with Cephalosporin use.  . Quinine Derivatives Rash  . Sulfa Antibiotics Rash  . Telithromycin Rash   Prior to Admission medications   Medication Sig Start Date End Date Taking? Authorizing Provider  acetaminophen (TYLENOL) 500 MG tablet Take 1,000 mg by mouth every 4 (four) hours as needed for mild pain or headache.    Yes Historical Provider, MD  albuterol (PROVENTIL HFA;VENTOLIN HFA) 108 (90 BASE)  MCG/ACT inhaler Inhale 2 puffs into the lungs every 6 (six) hours as needed for wheezing or shortness of breath. 04/17/15  Yes Emily Filbert, MD  calcium carbonate 1250 MG capsule Take 1,250 mg by mouth 2 (two) times daily with a meal.   Yes Historical Provider, MD  cefUROXime (CEFTIN) 250 MG tablet Take 250 mg by mouth 2 (two) times daily with a meal. 03/03/16 03/10/16 Yes Historical Provider, MD  cetirizine (ZYRTEC) 10 MG tablet Take 10 mg by mouth daily.   Yes Historical Provider, MD  Cranberry 500 MG CAPS Take 500 mg by mouth 2 (two) times daily.    Yes Historical Provider, MD  furosemide (LASIX) 40 MG tablet Take 80 mg by mouth daily.   Yes Historical Provider, MD  metoprolol tartrate (LOPRESSOR) 25 MG tablet Take 12.5-25 mg by mouth 2 (two) times  daily. Pt takes one tablet in the morning and one-half tablet at night.   Yes Historical Provider, MD  Multiple Vitamin (MULTIVITAMIN WITH MINERALS) TABS tablet Take 1 tablet by mouth daily.   Yes Historical Provider, MD  potassium chloride (K-DUR,KLOR-CON) 10 MEQ tablet Take 20 mEq by mouth daily.    Yes Historical Provider, MD  psyllium (METAMUCIL) 58.6 % packet Take 1 packet by mouth at bedtime.   Yes Historical Provider, MD  simvastatin (ZOCOR) 10 MG tablet Take 10 mg by mouth at bedtime.   Yes Historical Provider, MD  sotalol (BETAPACE) 80 MG tablet Take 80 mg by mouth daily.   Yes Historical Provider, MD  valACYclovir (VALTREX) 1000 MG tablet Take 1,000 mg by mouth 2 (two) times daily. 03/03/16 03/08/16 Yes Historical Provider, MD  vitamin C (ASCORBIC ACID) 500 MG tablet Take 500 mg by mouth daily.   Yes Historical Provider, MD  warfarin (COUMADIN) 2.5 MG tablet Take 2.5 mg by mouth at bedtime. Pt takes this dose Monday-Friday.   Yes Historical Provider, MD  warfarin (COUMADIN) 5 MG tablet Take 5 mg by mouth 2 (two) times a week. Pt takes this dose on Saturday and Sunday.   Yes Historical Provider, MD   Dg Thoracic Spine 2 View  03/05/2016  CLINICAL DATA:  Fall with back pain.  Initial encounter. EXAM: THORACIC SPINE 2 VIEWS COMPARISON:  04/17/2015 chest x-ray FINDINGS: There is no evidence of thoracic spine acute fracture or subluxation. Exaggerated thoracic kyphosis and bulky lower thoracic spondylosis. Lumbar findings reported separately. Chronic bilateral interstitial coarsening.  Biventricular ICD/pacer. IMPRESSION: No acute finding at the thoracic levels. Electronically Signed   By: Marnee Spring M.D.   On: 03/05/2016 22:07   Dg Lumbar Spine 2-3 Views  03/05/2016  CLINICAL DATA:  Fall with lower back pain. Initial encounter. EXAM: LUMBAR SPINE - 2-3 VIEW COMPARISON:  Abdominal CT 03/21/2013 FINDINGS: L2 body fracture with superior endplate compression causing ~50% height loss at maximum.  Fracture age is indeterminate. No retropulsion or subluxation. L3 superior endplate and L4 inferior endplate compression fractures are chronic and stable from prior. No evidence of traumatic malalignment. Chronic L4-5 mild anterolisthesis associated with facet arthropathy. Advanced L5-S1 degenerative disc narrowing. Upper lumbar spondylotic endplate spurring. Osteopenia. IMPRESSION: 1. Age-indeterminate L2 compression fracture with ~50% height loss. Negative for retropulsion. 2. Remote L3 and L4 compression fractures. Electronically Signed   By: Marnee Spring M.D.   On: 03/05/2016 22:10   Ct Head Wo Contrast  03/05/2016  CLINICAL DATA:  Larey Seat backwards while walking. Hit head, and landed on back. Concern for cervical spine injury. Initial encounter. EXAM: CT HEAD  WITHOUT CONTRAST CT CERVICAL SPINE WITHOUT CONTRAST TECHNIQUE: Multidetector CT imaging of the head and cervical spine was performed following the standard protocol without intravenous contrast. Multiplanar CT image reconstructions of the cervical spine were also generated. COMPARISON:  CT of the head performed 07/27/2012 FINDINGS: CT HEAD FINDINGS There is no evidence of acute infarction, mass lesion, or intra- or extra-axial hemorrhage on CT. Prominence of the ventricles and sulci reflects mild to moderate cortical volume loss. Mild cerebellar atrophy is noted. Mild periventricular white matter change likely reflects small ischemic microangiopathy. The brainstem and fourth ventricle are within normal limits. The basal ganglia are unremarkable in appearance. The cerebral hemispheres demonstrate grossly normal gray-white differentiation. No mass effect or midline shift is seen. There is no evidence of fracture; visualized osseous structures are unremarkable in appearance. The visualized portions of the orbits are within normal limits. The paranasal sinuses and mastoid air cells are well-aerated. No significant soft tissue abnormalities are seen. CT  CERVICAL SPINE FINDINGS There is no evidence of acute fracture or subluxation. There is mild grade 1 anterolisthesis of C3 on C4, and multilevel disc space narrowing along the cervical spine, with scattered anterior and posterior disc osteophyte complexes. Underlying facet disease is noted. Vertebral bodies demonstrate normal height. Prevertebral soft tissues are within normal limits. A heterogeneous 3.4 cm mass is noted at the right thyroid lobe, with minimal calcification. A small right-sided pleural effusion is noted. Mild interstitial prominence is noted at the lung apices. Prominent calcification is seen at the carotid bifurcations bilaterally. IMPRESSION: 1. No evidence of traumatic intracranial injury or fracture. 2. No evidence of fracture or subluxation along the cervical spine. 3. Mild to moderate cortical volume loss and scattered small vessel ischemic microangiopathy. 4. Mild degenerative change along the cervical spine. 5. Heterogeneous 3.4 cm mass at the right thyroid lobe, with minimal calcification. Recommend further evaluation with thyroid ultrasound. If patient is clinically hyperthyroid, consider nuclear medicine thyroid uptake and scan. 6. Small right-sided pleural effusion noted. Mild interstitial prominence at the lung apices. 7. Prominent calcification at the carotid bifurcations bilaterally. Carotid ultrasound would be helpful for further evaluation, when and as deemed clinically appropriate. Electronically Signed   By: Roanna Raider M.D.   On: 03/05/2016 21:59   Ct Cervical Spine Wo Contrast  03/05/2016  CLINICAL DATA:  Larey Seat backwards while walking. Hit head, and landed on back. Concern for cervical spine injury. Initial encounter. EXAM: CT HEAD WITHOUT CONTRAST CT CERVICAL SPINE WITHOUT CONTRAST TECHNIQUE: Multidetector CT imaging of the head and cervical spine was performed following the standard protocol without intravenous contrast. Multiplanar CT image reconstructions of the  cervical spine were also generated. COMPARISON:  CT of the head performed 07/27/2012 FINDINGS: CT HEAD FINDINGS There is no evidence of acute infarction, mass lesion, or intra- or extra-axial hemorrhage on CT. Prominence of the ventricles and sulci reflects mild to moderate cortical volume loss. Mild cerebellar atrophy is noted. Mild periventricular white matter change likely reflects small ischemic microangiopathy. The brainstem and fourth ventricle are within normal limits. The basal ganglia are unremarkable in appearance. The cerebral hemispheres demonstrate grossly normal gray-white differentiation. No mass effect or midline shift is seen. There is no evidence of fracture; visualized osseous structures are unremarkable in appearance. The visualized portions of the orbits are within normal limits. The paranasal sinuses and mastoid air cells are well-aerated. No significant soft tissue abnormalities are seen. CT CERVICAL SPINE FINDINGS There is no evidence of acute fracture or subluxation. There is mild grade 1  anterolisthesis of C3 on C4, and multilevel disc space narrowing along the cervical spine, with scattered anterior and posterior disc osteophyte complexes. Underlying facet disease is noted. Vertebral bodies demonstrate normal height. Prevertebral soft tissues are within normal limits. A heterogeneous 3.4 cm mass is noted at the right thyroid lobe, with minimal calcification. A small right-sided pleural effusion is noted. Mild interstitial prominence is noted at the lung apices. Prominent calcification is seen at the carotid bifurcations bilaterally. IMPRESSION: 1. No evidence of traumatic intracranial injury or fracture. 2. No evidence of fracture or subluxation along the cervical spine. 3. Mild to moderate cortical volume loss and scattered small vessel ischemic microangiopathy. 4. Mild degenerative change along the cervical spine. 5. Heterogeneous 3.4 cm mass at the right thyroid lobe, with minimal  calcification. Recommend further evaluation with thyroid ultrasound. If patient is clinically hyperthyroid, consider nuclear medicine thyroid uptake and scan. 6. Small right-sided pleural effusion noted. Mild interstitial prominence at the lung apices. 7. Prominent calcification at the carotid bifurcations bilaterally. Carotid ultrasound would be helpful for further evaluation, when and as deemed clinically appropriate. Electronically Signed   By: Roanna RaiderJeffery  Chang M.D.   On: 03/05/2016 21:59   Ct Lumbar Spine Wo Contrast  03/05/2016  CLINICAL DATA:  Status post fall, with lower back pain. Initial encounter. EXAM: CT LUMBAR SPINE WITHOUT CONTRAST TECHNIQUE: Multidetector CT imaging of the lumbar spine was performed without intravenous contrast administration. Multiplanar CT image reconstructions were also generated. COMPARISON:  CT of the abdomen and pelvis performed 03/21/2013, and lumbar spine radiographs performed earlier today at 9:34 p.m. FINDINGS: There is a compression deformity involving vertebral body L2, with nearly 70% loss of height. This is new from 2014, though of indeterminate age. Associated fractures are noted through the anterior osteophytes at the inferior endplate of L1 and superior endplate of L2. There is no evidence of extension through the posterior elements. There is underlying chronic compression deformity involving the superior endplate of L3 and inferior endplate of L4, relatively stable from 2014, and mild sclerotic change at the inferior endplate of L1. Underlying facet disease is noted along the lumbar spine. Vacuum phenomenon is noted along the lower lumbar spine. A small to moderate right-sided pleural effusion is noted. Diffuse calcification is seen along the abdominal aorta and its branches. Mild atrophy is noted with regard to the paraspinal musculature. The visualized soft tissues are otherwise grossly unremarkable. IMPRESSION: 1. New compression deformity involving vertebral  body L2, with nearly 70% loss of height, of indeterminate age. Associated fractures through the anterior osteophytes at the inferior endplate of L1 and superior endplate of L2. No evidence of extension through the posterior elements. 2. Chronic compression deformity involving the superior endplate of L3 and inferior endplate of L4, relatively stable from 2014. Mild degenerative change along the lumbar spine. 3. Small to moderate right-sided pleural effusion. 4. Diffuse calcification along the abdominal aorta and its branches. Electronically Signed   By: Roanna RaiderJeffery  Chang M.D.   On: 03/05/2016 23:26    Positive ROS: All other systems have been reviewed and were otherwise negative with the exception of those mentioned in the HPI and as above.  Physical Exam: General: Alert, no acute distress Cardiovascular: No pedal edema Respiratory: No cyanosis, no use of accessory musculature GI: No organomegaly, abdomen is soft and non-tender Skin: No lesions in the area of chief complaint Neurologic: Sensation intact distally Psychiatric: Patient is competent for consent with normal mood and affect Lymphatic: No axillary or cervical lymphadenopathy  MUSCULOSKELETAL:  Patient alert and cooperative.  Head and neck has fair range of motion with minimal pain.  She is mildly tender in the upper thoracic region.  The arms have good neurovascular status in movement.  She has little or no tenderness to palpation in the lumbar region.  Range of motion of the hips and knees is good and neurovascular status is intact distally.  Assessment: Fall with upper thoracic and some low back pain  Plan: Symptomatic care and PT.  The L2 fracture does not appear acute to me. Follow-up as needed.  Valinda Hoar, MD [phone]   03/06/2016 1:24 PM

## 2016-03-06 NOTE — ED Notes (Signed)
Daughter reports that if patient is taking antibiotics the only takes 1 Warfarin tablet on Saturday and Sunday instead of 2 tablets.

## 2016-03-06 NOTE — Progress Notes (Signed)
Patient ID: Courtney Pruitt, female   DOB: June 20, 1926, 82 y.o.   MRN: [id number] [facility] [place] [facility]   PATIENT NAME: Courtney Pruitt    MR#:  [id number]  DATE OF BIRTH:  10/12/1926  SUBJECTIVE:  Came in after fall. Severe back pain  REVIEW OF SYSTEMS:   Review of Systems  Constitutional: Negative for fever, chills and weight loss.  HENT: Negative for ear discharge, ear pain and nosebleeds.   Eyes: Negative for blurred vision, pain and discharge.  Respiratory: Negative for sputum production, shortness of breath, wheezing and stridor.   Cardiovascular: Negative for chest pain, palpitations, orthopnea and PND.  Gastrointestinal: Negative for nausea, vomiting, abdominal pain and diarrhea.  Genitourinary: Negative for urgency and frequency.  Musculoskeletal: Positive for back pain. Negative for joint pain.  Neurological: Positive for weakness. Negative for sensory change, speech change and focal weakness.  Psychiatric/Behavioral: Negative for depression and hallucinations. The patient is not nervous/anxious.   All other systems reviewed and are negative.  Tolerating Diet:yes Tolerating PT: pending  DRUG ALLERGIES:   Allergies  Allergen Reactions  . Ace Inhibitors Other (See Comments)    Reaction: Unknown  . Amiodarone Other (See Comments)    Reaction:  Pt has difficulty balancing   . Clindamycin/Lincomycin Other (See Comments)    Reaction:  Difficulty swallowing   . Codeine Other (See Comments)    Reaction: GI upset   . Fluorescein Itching and Other (See Comments)    Reaction:  Throat tightness    . Iodinated Diagnostic Agents Itching and Other (See Comments)    Reaction:  Throat tightness    . Lovastatin Other (See Comments)    Reaction:  Muscle pain   . Nifedipine Other (See Comments)    Reaction:  Unknown   . Cefpodoxime Rash  . Cinoxacin Rash  . Ciprofloxacin Rash  . Doxazosin Rash  . Indapamide Rash  . Nitrofurantoin Rash  .  Penicillins Rash and Other (See Comments)    Has patient had a PCN reaction causing immediate rash, facial/tongue/throat swelling, SOB or lightheadedness with hypotension: No Has patient had a PCN reaction causing severe rash involving mucus membranes or skin necrosis: No Has patient had a PCN reaction that required hospitalization No Has patient had a PCN reaction occurring within the last 10 years: No If all of the above answers are "NO", then may proceed with Cephalosporin use.  . Quinine Derivatives Rash  . Sulfa Antibiotics Rash  . Telithromycin Rash    VITALS:  Blood pressure 147/60, pulse 68, temperature 97.6 F (36.4 C), temperature source Oral, resp. rate 18, height  (1.575 m), weight 62.097 kg (136 lb 14.4 oz), SpO2 98 %.  PHYSICAL EXAMINATION:   Physical Exam  GENERAL:  82 y.o.-year-old patient lying in the bed with no acute distress.  EYES: Pupils equal, round, reactive to light and accommodation. No scleral icterus. Extraocular muscles intact.  HEENT: Head atraumatic, normocephalic. Oropharynx and nasopharynx clear.  NECK:  Supple, no jugular venous distention. No thyroid enlargement, no tenderness.  LUNGS: Normal breath sounds bilaterally, no wheezing, rales, rhonchi. No use of accessory muscles of respiration.  CARDIOVASCULAR: S1, S2 normal. No murmurs, rubs, or gallops.  ABDOMEN: Soft, nontender, nondistended. Bowel sounds present. No organomegaly or mass.  EXTREMITIES: No cyanosis, clubbing or edema b/l.    NEUROLOGIC: Cranial nerves II through XII are intact. No focal Motor or sensory deficits b/l.   PSYCHIATRIC:  patient is alert and oriented x 3.  SKIN: No obvious rash, lesion, or ulcer.   LABORATORY PANEL:  CBC  Recent Labs Lab 03/05/16 2301  WBC 6.6  HGB 12.1  HCT 35.0  PLT 217    Chemistries   Recent Labs Lab 03/05/16 2301  NA 127*  K 3.9  CL 94*  CO2 25  GLUCOSE 103*  BUN 23*  CREATININE 0.76  CALCIUM 9.0   Cardiac Enzymes No  results for input(s): TROPONINI in the last 168 hours. RADIOLOGY:  Dg Thoracic Spine 2 View  03/05/2016  CLINICAL DATA:  Fall with back pain.  Initial encounter. EXAM: THORACIC SPINE 2 VIEWS COMPARISON:  04/17/2015 chest x-ray FINDINGS: There is no evidence of thoracic spine acute fracture or subluxation. Exaggerated thoracic kyphosis and bulky lower thoracic spondylosis. Lumbar findings reported separately. Chronic bilateral interstitial coarsening.  Biventricular ICD/pacer. IMPRESSION: No acute finding at the thoracic levels. Electronically Signed   By: Marnee Spring M.D.   On: 03/05/2016 22:07   Dg Lumbar Spine 2-3 Views  03/05/2016  CLINICAL DATA:  Fall with lower back pain. Initial encounter. EXAM: LUMBAR SPINE - 2-3 VIEW COMPARISON:  Abdominal CT 03/21/2013 FINDINGS: L2 body fracture with superior endplate compression causing ~50% height loss at maximum. Fracture age is indeterminate. No retropulsion or subluxation. L3 superior endplate and L4 inferior endplate compression fractures are chronic and stable from prior. No evidence of traumatic malalignment. Chronic L4-5 mild anterolisthesis associated with facet arthropathy. Advanced L5-S1 degenerative disc narrowing. Upper lumbar spondylotic endplate spurring. Osteopenia. IMPRESSION: 1. Age-indeterminate L2 compression fracture with ~50% height loss. Negative for retropulsion. 2. Remote L3 and L4 compression fractures. Electronically Signed   By: Marnee Spring M.D.   On: 03/05/2016 22:10   Ct Head Wo Contrast  03/05/2016  CLINICAL DATA:  Larey Seat backwards while walking. Hit head, and landed on back. Concern for cervical spine injury. Initial encounter. EXAM: CT HEAD WITHOUT CONTRAST CT CERVICAL SPINE WITHOUT CONTRAST TECHNIQUE: Multidetector CT imaging of the head and cervical spine was performed following the standard protocol without intravenous contrast. Multiplanar CT image reconstructions of the cervical spine were also generated. COMPARISON:  CT  of the head performed 07/27/2012 FINDINGS: CT HEAD FINDINGS There is no evidence of acute infarction, mass lesion, or intra- or extra-axial hemorrhage on CT. Prominence of the ventricles and sulci reflects mild to moderate cortical volume loss. Mild cerebellar atrophy is noted. Mild periventricular white matter change likely reflects small ischemic microangiopathy. The brainstem and fourth ventricle are within normal limits. The basal ganglia are unremarkable in appearance. The cerebral hemispheres demonstrate grossly normal gray-white differentiation. No mass effect or midline shift is seen. There is no evidence of fracture; visualized osseous structures are unremarkable in appearance. The visualized portions of the orbits are within normal limits. The paranasal sinuses and mastoid air cells are well-aerated. No significant soft tissue abnormalities are seen. CT CERVICAL SPINE FINDINGS There is no evidence of acute fracture or subluxation. There is mild grade 1 anterolisthesis of C3 on C4, and multilevel disc space narrowing along the cervical spine, with scattered anterior and posterior disc osteophyte complexes. Underlying facet disease is noted. Vertebral bodies demonstrate normal height. Prevertebral soft tissues are within normal limits. A heterogeneous 3.4 cm mass is noted at the right thyroid lobe, with minimal calcification. A small right-sided pleural effusion is noted. Mild interstitial prominence is noted at the lung apices. Prominent calcification is seen at the carotid bifurcations bilaterally. IMPRESSION: 1. No evidence of traumatic intracranial injury or fracture. 2. No evidence of fracture or  subluxation along the cervical spine. 3. Mild to moderate cortical volume loss and scattered small vessel ischemic microangiopathy. 4. Mild degenerative change along the cervical spine. 5. Heterogeneous 3.4 cm mass at the right thyroid lobe, with minimal calcification. Recommend further evaluation with thyroid  ultrasound. If patient is clinically hyperthyroid, consider nuclear medicine thyroid uptake and scan. 6. Small right-sided pleural effusion noted. Mild interstitial prominence at the lung apices. 7. Prominent calcification at the carotid bifurcations bilaterally. Carotid ultrasound would be helpful for further evaluation, when and as deemed clinically appropriate. Electronically Signed   By: Roanna Raider M.D.   On: 03/05/2016 21:59   Ct Cervical Spine Wo Contrast  03/05/2016  CLINICAL DATA:  Larey Seat backwards while walking. Hit head, and landed on back. Concern for cervical spine injury. Initial encounter. EXAM: CT HEAD WITHOUT CONTRAST CT CERVICAL SPINE WITHOUT CONTRAST TECHNIQUE: Multidetector CT imaging of the head and cervical spine was performed following the standard protocol without intravenous contrast. Multiplanar CT image reconstructions of the cervical spine were also generated. COMPARISON:  CT of the head performed 07/27/2012 FINDINGS: CT HEAD FINDINGS There is no evidence of acute infarction, mass lesion, or intra- or extra-axial hemorrhage on CT. Prominence of the ventricles and sulci reflects mild to moderate cortical volume loss. Mild cerebellar atrophy is noted. Mild periventricular white matter change likely reflects small ischemic microangiopathy. The brainstem and fourth ventricle are within normal limits. The basal ganglia are unremarkable in appearance. The cerebral hemispheres demonstrate grossly normal gray-white differentiation. No mass effect or midline shift is seen. There is no evidence of fracture; visualized osseous structures are unremarkable in appearance. The visualized portions of the orbits are within normal limits. The paranasal sinuses and mastoid air cells are well-aerated. No significant soft tissue abnormalities are seen. CT CERVICAL SPINE FINDINGS There is no evidence of acute fracture or subluxation. There is mild grade 1 anterolisthesis of C3 on C4, and multilevel disc  space narrowing along the cervical spine, with scattered anterior and posterior disc osteophyte complexes. Underlying facet disease is noted. Vertebral bodies demonstrate normal height. Prevertebral soft tissues are within normal limits. A heterogeneous 3.4 cm mass is noted at the right thyroid lobe, with minimal calcification. A small right-sided pleural effusion is noted. Mild interstitial prominence is noted at the lung apices. Prominent calcification is seen at the carotid bifurcations bilaterally. IMPRESSION: 1. No evidence of traumatic intracranial injury or fracture. 2. No evidence of fracture or subluxation along the cervical spine. 3. Mild to moderate cortical volume loss and scattered small vessel ischemic microangiopathy. 4. Mild degenerative change along the cervical spine. 5. Heterogeneous 3.4 cm mass at the right thyroid lobe, with minimal calcification. Recommend further evaluation with thyroid ultrasound. If patient is clinically hyperthyroid, consider nuclear medicine thyroid uptake and scan. 6. Small right-sided pleural effusion noted. Mild interstitial prominence at the lung apices. 7. Prominent calcification at the carotid bifurcations bilaterally. Carotid ultrasound would be helpful for further evaluation, when and as deemed clinically appropriate. Electronically Signed   By: Roanna Raider M.D.   On: 03/05/2016 21:59   Ct Lumbar Spine Wo Contrast  03/05/2016  CLINICAL DATA:  Status post fall, with lower back pain. Initial encounter. EXAM: CT LUMBAR SPINE WITHOUT CONTRAST TECHNIQUE: Multidetector CT imaging of the lumbar spine was performed without intravenous contrast administration. Multiplanar CT image reconstructions were also generated. COMPARISON:  CT of the abdomen and pelvis performed 03/21/2013, and lumbar spine radiographs performed earlier today at 9:34 p.m. FINDINGS: There is a compression deformity involving  vertebral body L2, with nearly 70% loss of height. This is new from  2014, though of indeterminate age. Associated fractures are noted through the anterior osteophytes at the inferior endplate of L1 and superior endplate of L2. There is no evidence of extension through the posterior elements. There is underlying chronic compression deformity involving the superior endplate of L3 and inferior endplate of L4, relatively stable from 2014, and mild sclerotic change at the inferior endplate of L1. Underlying facet disease is noted along the lumbar spine. Vacuum phenomenon is noted along the lower lumbar spine. A small to moderate right-sided pleural effusion is noted. Diffuse calcification is seen along the abdominal aorta and its branches. Mild atrophy is noted with regard to the paraspinal musculature. The visualized soft tissues are otherwise grossly unremarkable. IMPRESSION: 1. New compression deformity involving vertebral body L2, with nearly 70% loss of height, of indeterminate age. Associated fractures through the anterior osteophytes at the inferior endplate of L1 and superior endplate of L2. No evidence of extension through the posterior elements. 2. Chronic compression deformity involving the superior endplate of L3 and inferior endplate of L4, relatively stable from 2014. Mild degenerative change along the lumbar spine. 3. Small to moderate right-sided pleural effusion. 4. Diffuse calcification along the abdominal aorta and its branches. Electronically Signed   By: Roanna RaiderJeffery  Chang M.D.   On: 03/05/2016 23:26   ASSESSMENT AND PLAN:   82 year old female admitted for intractable pain 1. Intractable pain: Secondary to L-2 vertebral compression fracture.  -prn pain meds 2. Compression fracture: Acute L2 fracture; multiple chronic fractures. No spinal impingement. The patient has no neurologic deficits -ortho consult. 3. Essential hypertension: Continue metoprolol 4. Atrial fibrillation: Rate controlled; continue sotalol as well as warfarin 5. Osteoporosis: Continue calcium  and vitamin D supplementation 6. DVT prophylaxis: As above 7. GI prophylaxis: None Case discussed with Care Management/Social Worker. Management plans discussed with the patient and they are in agreement.  CODE STATUS: full DVT Prophylaxis:warfarin  TOTAL TIME TAKING CARE OF THIS PATIENT:5930minutes.  >50% time spent on counselling and coordination of care  POSSIBLE D/C IN 1-2 dAYS, DEPENDING ON CLINICAL CONDITION.  Note: This dictation was prepared with Dragon dictation along with smaller phrase technology. Any transcriptional errors that result from this process are unintentional.  Lamaj Metoyer M.D on 03/06/2016 at 10:37 AM  Between 7am to 6pm - Pager - [phone]  After 6pm go to [email or web address] - password [email or web address] [facility]  [facility]  Office  [phone]  CC: Primary care physician; Danella PentonMark F Miller, MD

## 2016-03-06 NOTE — ED Notes (Signed)
Report called to floor, given to Tracy. 

## 2016-03-06 NOTE — Care Management Note (Addendum)
Case Management Note  Patient Details  Name: [name] MRN: [id number] Date of Birth: 1926/10/15  Subjective/Objective:    82yo Courtney Pruitt was admitted 03/05/16 to an Observation bed after a fall at home. Hx; a-Fib and HTN. Radiology was reviewed by Dr Deeann SaintHoward Miller who noted no acute fractures but has several thoracic fractures and an L2 compression fracture none of which are new. Courtney Pruitt was admitted for pain control and is not to be ambulated per Dr Hyacinth MeekerMiller. Pharmacy=[facility] [address]. PCP=Dr Bethann PunchesMark Miller. Has a rolling walker at home. Courtney Pruitt resides with her husband. Either husband or her son will provide transportation to appointments. Care management will follow for discharge planning.                 Action/Plan:   Expected Discharge Date:                  Expected Discharge Plan:     In-House Referral:     Discharge planning Services     Post Acute Care Choice:    Choice offered to:     DME Arranged:    DME Agency:     HH Arranged:    HH Agency:     Status of Service:     Medicare Important Message Given:    Date Medicare IM Given:    Medicare IM give by:    Date Additional Medicare IM Given:    Additional Medicare Important Message give by:     If discussed at Long Length of Stay Meetings, dates discussed:    Additional Comments:  Courtney Pruitt A, RN 03/06/2016, 3:16 PM

## 2016-03-07 LAB — PROTIME-INR
INR: 2.27
Prothrombin Time: 24.8 seconds — ABNORMAL HIGH (ref 11.4–15.0)

## 2016-03-07 MED ORDER — OXYCODONE-ACETAMINOPHEN 5-325 MG PO TABS: 1.0000 | ORAL_TABLET | Freq: Four times a day (QID) | ORAL | Status: DC | PRN

## 2016-03-07 MED ORDER — WARFARIN SODIUM 5 MG PO TABS
2.5000 mg | ORAL_TABLET | ORAL | Status: DC
  Administered 2016-03-07: 2.5 mg via ORAL
  Filled 2016-03-07: qty 1

## 2016-03-07 MED ORDER — ENSURE ENLIVE PO LIQD
237.0000 mL | Freq: Two times a day (BID) | ORAL | Status: DC
  Administered 2016-03-07 – 2016-03-08 (×3): 237 mL via ORAL

## 2016-03-07 MED ORDER — WARFARIN SODIUM 5 MG PO TABS: 5.0000 mg | ORAL_TABLET | ORAL | Status: DC

## 2016-03-07 NOTE — NC FL2 (Signed)
[place] MEDICAID FL2 LEVEL OF CARE SCREENING TOOL     IDENTIFICATION  Patient Name: Courtney Pruitt Birthdate: May 26, 1926 Sex: female Admission Date (Current Location): 03/05/2016  [place] and [place] Number:  Chiropodist and Address:  [facility], [address], [place], [place] [address]      Provider Number: [id number]  Attending Physician Name and Address:  Shaune Pollack, MD  Relative Name and Phone Number:       Current Level of Care: Hospital Recommended Level of Care: Skilled Nursing Facility Prior Approval Number:    Date Approved/Denied:   PASRR Number:  [id number])  Discharge Plan: SNF    Current Diagnoses: Patient Active Problem List   Diagnosis Date Noted  . Incomplete bladder emptying 03/06/2016  . Intractable pain 03/05/2016    Orientation RESPIRATION BLADDER Height & Weight     Self, Time, Situation, Place  Normal Incontinent Weight: 137 lb 1.9 oz (62.197 kg) Height:   (157.5 cm)  BEHAVIORAL SYMPTOMS/MOOD NEUROLOGICAL BOWEL NUTRITION STATUS   (none )  (none ) Continent Diet (Diet: Regular )  AMBULATORY STATUS COMMUNICATION OF NEEDS Skin   Extensive Assist Verbally Normal                       Personal Care Assistance Level of Assistance  Bathing, Feeding, Dressing Bathing Assistance: Limited assistance Feeding assistance: Independent Dressing Assistance: Limited assistance     Functional Limitations Info  Sight, Hearing, Speech Sight Info: Adequate Hearing Info: Adequate Speech Info: Adequate    SPECIAL CARE FACTORS FREQUENCY  PT (By licensed PT), OT (By licensed OT)     PT Frequency:  (5) OT Frequency:  (5)            Contractures      Additional Factors Info  Code Status, Allergies Code Status Info:  (Full Code. ) Allergies Info: Telithromycin (Ace Inhibitors, Amiodarone, Clindamycin/lincomycin, Codeine, Fluorescein, Iodinated Diagnostic Agents, Lovastatin,Nifedipine, Cefpodoxime,  Cinoxacin, Ciprofloxacin, Doxazosin, Indapamide, Nitrofurantoin, Penicillins, Quinine Derivatives, Sulfa Antibiotic)           Current Medications (03/07/2016):  This is the current hospital active medication list Current Facility-Administered Medications  Medication Dose Route Frequency Provider Last Rate Last Dose  . acetaminophen (TYLENOL) tablet 1,000 mg  1,000 mg Oral Q4H PRN Arnaldo Natal, MD   1,000 mg at 03/07/16 1109  . albuterol (PROVENTIL) (2.5 MG/3ML) 0.083% nebulizer solution 3 mL  3 mL Inhalation Q6H PRN Arnaldo Natal, MD      . calcium carbonate (TUMS - dosed in mg elemental calcium) chewable tablet 1,250 mg  1,250 mg Oral BID WC Arnaldo Natal, MD   1,250 mg at 03/07/16 5621  . docusate sodium (COLACE) capsule 100 mg  100 mg Oral BID Arnaldo Natal, MD   100 mg at 03/07/16 0815  . furosemide (LASIX) tablet 80 mg  80 mg Oral Daily Arnaldo Natal, MD   80 mg at 03/07/16 0815  . loratadine (CLARITIN) tablet 10 mg  10 mg Oral Daily Arnaldo Natal, MD   10 mg at 03/07/16 0815  . metoprolol tartrate (LOPRESSOR) tablet 25 mg  25 mg Oral Daily Arnaldo Natal, MD   25 mg at 03/07/16 0815   And  . metoprolol tartrate (LOPRESSOR) tablet 12.5 mg  12.5 mg Oral Q2200 Arnaldo Natal, MD   12.5 mg at 03/06/16 2118  . morphine 2 MG/ML injection 2 mg  2 mg Intravenous Q3H  PRN Arnaldo NatalMichael S Diamond, MD   2 mg at 03/06/16 1730  . multivitamin with minerals tablet 1 tablet  1 tablet Oral Q supper Arnaldo NatalMichael S Diamond, MD   1 tablet at 03/06/16 1722  . ondansetron (ZOFRAN) tablet 4 mg  4 mg Oral Q6H PRN Arnaldo NatalMichael S Diamond, MD       Or  . ondansetron [facility](ZOFRAN) injection 4 mg  4 mg Intravenous Q6H PRN Arnaldo NatalMichael S Diamond, MD   4 mg at 03/06/16 0955  . potassium chloride SA (K-DUR,KLOR-CON) CR tablet 20 mEq  20 mEq Oral Daily Arnaldo NatalMichael S Diamond, MD   20 mEq at 03/07/16 0814  . simvastatin (ZOCOR) tablet 10 mg  10 mg Oral QHS Arnaldo NatalMichael S Diamond, MD   10 mg at 03/06/16 2118  . sotalol  (BETAPACE) tablet 80 mg  80 mg Oral Daily Arnaldo NatalMichael S Diamond, MD   80 mg at 03/07/16 0815  . vitamin C (ASCORBIC ACID) tablet 500 mg  500 mg Oral Daily Arnaldo NatalMichael S Diamond, MD   500 mg at 03/07/16 0815  . warfarin (COUMADIN) tablet 2.5 mg  2.5 mg Oral Once per day on Mon Tue Wed Thu Fri Shaune PollackQing Neytiri Asche, MD      . Melene Muller[START ON 03/12/2016] warfarin (COUMADIN) tablet 5 mg  5 mg Oral Once per day on Sun Sat Shaune PollackQing Breea Loncar, MD      . Warfarin - Pharmacist Dosing Inpatient   Does not apply q1800 Arnaldo NatalMichael S Diamond, MD         Discharge Medications: Please see discharge summary for a list of discharge medications.  Relevant Imaging Results:  Relevant Lab Results:   Additional Information  (SSN: [id number])  Haig ProphetMorgan, Bailey G, LCSW

## 2016-03-07 NOTE — Progress Notes (Signed)
[facility]. Patient resting quietly - up in chair with family at bedside. No complaints at this time. Will continue to monitor.

## 2016-03-07 NOTE — Clinical Social Work Placement (Signed)
   CLINICAL SOCIAL WORK PLACEMENT  NOTE  Date:  03/07/2016  Patient Details  Name: Theda BelfastRuth C Springer MRN: [id number] Date of Birth: [birth year]  Clinical Social Work is seeking post-discharge placement for this patient at the Skilled  Nursing Facility level of care (*CSW will initial, date and re-position this form in  chart as items are completed):  Yes   Patient/family provided with [facility] Clinical Social Work Department's list of facilities offering this level of care within the geographic area requested by the patient (or if unable, by the patient's family).  Yes   Patient/family informed of their freedom to choose among providers that offer the needed level of care, that participate in Medicare, Medicaid or managed care program needed by the patient, have an available bed and are willing to accept the patient.  Yes   Patient/family informed of [facility]'s ownership interest in [facility] and [facility], as well as of the fact that they are under no obligation to receive care at these facilities.  PASRR submitted to EDS on       PASRR number received on       Existing PASRR number confirmed on 03/07/16     FL2 transmitted to all facilities in geographic area requested by pt/family on 03/07/16     FL2 transmitted to all facilities within larger geographic area on       Patient informed that his/her managed care company has contracts with or will negotiate with certain facilities, including the following:        Yes   Patient/family informed of bed offers received.  Patient chooses bed at  [facility]     Physician recommends and patient chooses bed at      Patient to be transferred to   on  .  Patient to be transferred to facility by       Patient family notified on   of transfer.  Name of family member notified:        PHYSICIAN       Additional Comment:    _______________________________________________ Haig ProphetMorgan, Alliana Mcauliff G, LCSW 03/07/2016, 3:13  PM

## 2016-03-07 NOTE — Progress Notes (Signed)
Initial Nutrition Assessment  DOCUMENTATION CODES:   Not applicable  INTERVENTION:   -Cater to pt preferences on Regular diet order at this time. If pt po intake improved, pt may benefit from Heart Healthy Diet order -Recommend Ensure Enlive po BID, each supplement provides 350 kcal and 20 grams of protein -Agree with daily weights  NUTRITION DIAGNOSIS:   Inadequate oral intake related to acute illness as evidenced by per patient/family report, meal completion < 50%.  GOAL:   Patient will meet greater than or equal to 90% of their needs  MONITOR:   PO intake, Supplement acceptance, Labs, Weight trends, I & O's  REASON FOR ASSESSMENT:   Malnutrition Screening Tool    ASSESSMENT:   Pt admitted with fall and intractible pain. Per MD note pt with L2 verebral compression fracutre.   Past Medical History  Diagnosis Date  . Hypertension   . CHF (congestive heart failure) (HCC)   . Hyperlipidemia   . Atrial fibrillation (HCC)    Diet Order:  Diet regular Room service appropriate?: Yes; Fluid consistency:: Thin   Pt reports eating fish, with 100% of pudding and applesauce at lunch today. Pt reports poor po intake since admission. Pt reports usually having cereal for breakfast (notably high in fiber to aid with chronic constipation), soup or sandwich at lunch and meat with vegetables for dinner.  Writer asked if pt eats half of her dinner plate usually, to which pt replied 'oh, yes.' Pt reports not eating as much as she used to as she is not as mobile as she used to be. Pt reports drinking a supplement drink daily but family could not remember name. Pt reports no trouble chewing but sometimes swallowing, especially breads.  Medications: Calcium carbonate, Lasix, MVI, KCl, Coumadin, Vitamin C,  Labs: Na 127   Gastrointestinal Profile: Last BM:  03/05/2016   Nutrition-Focused Physical Exam Findings: Nutrition-Focused physical exam completed. Findings are no fat depletion,  mild-moderate muscle depletion, and moderate edema of lower extremities.     Weight Change: Pt reports her weight has been very stable. Pt reports due to CHF, she has been weighing herself daily for a long time. Pt reports it is always right at 133lbs.    Skin:  Reviewed, no issues  Height:   Ht Readings from Last 1 Encounters:  03/05/16 5\' 2"  (1.575 m)    Weight:   Wt Readings from Last 1 Encounters:  03/07/16 137 lb 1.9 oz (62.197 kg)     BMI:  Body mass index is 25.07 kg/(m^2).  Estimated Nutritional Needs:   Kcal:  1330-1572kcals  Protein:  62-74g protein  Fluid:  1.4-1.6L fluid  EDUCATION NEEDS:   No education needs identified at this time  Leda QuailAllyson Virga Haltiwanger, RD, LDN Pager [phone] Weekend/On-Call Pager [phone]

## 2016-03-07 NOTE — Progress Notes (Signed)
ANTICOAGULATION CONSULT NOTE - Initial Consult  Pharmacy Consult for warfarin Indication: atrial fibrillation  Allergies  Allergen Reactions  . Ace Inhibitors Other (See Comments)    Reaction: Unknown  . Amiodarone Other (See Comments)    Reaction:  Pt has difficulty balancing   . Clindamycin/Lincomycin Other (See Comments)    Reaction:  Difficulty swallowing   . Codeine Other (See Comments)    Reaction: GI upset   . Fluorescein Itching and Other (See Comments)    Reaction:  Throat tightness    . Iodinated Diagnostic Agents Itching and Other (See Comments)    Reaction:  Throat tightness    . Lovastatin Other (See Comments)    Reaction:  Muscle pain   . Nifedipine Other (See Comments)    Reaction:  Unknown   . Cefpodoxime Rash  . Cinoxacin Rash  . Ciprofloxacin Rash  . Doxazosin Rash  . Indapamide Rash  . Nitrofurantoin Rash  . Penicillins Rash and Other (See Comments)    Has patient had a PCN reaction causing immediate rash, facial/tongue/throat swelling, SOB or lightheadedness with hypotension: No Has patient had a PCN reaction causing severe rash involving mucus membranes or skin necrosis: No Has patient had a PCN reaction that required hospitalization No Has patient had a PCN reaction occurring within the last 10 years: No If all of the above answers are "NO", then may proceed with Cephalosporin use.  . Quinine Derivatives Rash  . Sulfa Antibiotics Rash  . Telithromycin Rash    Patient Measurements: Height: 5\' 2"  (157.5 cm) Weight: 137 lb 1.9 oz (62.197 kg) IBW/kg (Calculated) : 50.1 Heparin Dosing Weight:   Vital Signs: Temp: 97.5 F (36.4 C) (05/01 0735) Temp Source: Axillary (05/01 0735) BP: 116/50 mmHg (05/01 0735) Pulse Rate: 60 (05/01 0735)  Labs:  Recent Labs  03/05/16 2301 03/07/16 0336  HGB 12.1  --   HCT 35.0  --   PLT 217  --   LABPROT 19.3* 24.8*  INR 1.62 2.27  CREATININE 0.76  --     Estimated Creatinine Clearance: 42.9 mL/min (by  C-G formula based on Cr of 0.76).   Assessment: 89 yof cc fall. Patient reports hitting her head. No evidence of hemorrhage on CT. Pharmacy consulted for warfarin dosing.  4/29 - INR: 1.62, warfarin 5mg  given 4/30 - INR: 2.27, 3.5mg  given  Goal of Therapy:  INR 2-3 Monitor platelets by anticoagulation protocol: Yes   Plan:  Will resume home regimen of warfarin 2.5 mg po daily Monday through Friday, and 5 mg po on Saturday and Sunday. Pharmacy will continue to follow and adjust as needed to maintain INR 2 to 3.   Garlon HatchetJody Arren Laminack, PharmD Clinical Pharmacist   03/07/2016,8:01 AM

## 2016-03-07 NOTE — Progress Notes (Signed)
[facility] Medicare authorization has been received. Auth # [id number] RVB. Kim admissions coordinator at [facility] is aware of above. Clinical Education officer, museum (CSW) met with patient and her husband at bedside and made them aware of above. CSW also attempted to contact patient's son Fara Olden however he did not answer and a voicemail was left. CSW will continue to follow and assist as needed.   Blima Rich, LCSW [phone]

## 2016-03-07 NOTE — Evaluation (Signed)
Physical Therapy Evaluation Patient Details Name: Courtney Pruitt MRN: [id number] DOB: 19-May-1926 Today's Date: 03/07/2016   History of Present Illness  Pt is an 82 y.o. female presenting to hospital s/p fall backwards onto her buttocks.  Imaging shows age indeterminate L2 compression fx (no retropulsion), and remote L3-4 compression fx's.  Pt admitted for intractable pain secondary to verterbral compression fx.  PMH includes a-fib, CHF, htn, pacemaker, knee surgery.  Clinical Impression  Prior to admission, pt was ambulating with rollator.  Pt lives with her husband on main level of home.  Currently pt is max assist supine to sit and min assist with transfers and ambulation 20 feet with RW (pt with significant decreased cadence, mildly unsteady, and shaky with ambulation).  Minimal increase in low back pain was reported by pt with functional mobility (pt was pre-medicated with pain medication prior to PT session).  Pt would benefit from skilled PT to address noted impairments and functional limitations.  D/t pt's current assist levels, limited assist reported to be available at home, and h/o falls, recommend pt discharge to STR when medically appropriate.     Follow Up Recommendations SNF    Equipment Recommendations  Rolling walker with 5" wheels    Recommendations for Other Services       Precautions / Restrictions Precautions Precautions: Fall Precaution Comments: spinal precautions Restrictions Weight Bearing Restrictions: No      Mobility  Bed Mobility Overal bed mobility: Needs Assistance Bed Mobility: Supine to Sit     Supine to sit: Max assist;HOB elevated     General bed mobility comments: vc's required for logrolling; assist for trunk mostly and some for B LE's  Transfers Overall transfer level: Needs assistance Equipment used: Rolling walker (2 wheeled) Transfers: Sit to/from Stand Sit to Stand: Min assist         General transfer comment: increased effort to  stand; vc's required for hand and feet placement  Ambulation/Gait Ambulation/Gait assistance: Min assist Ambulation Distance (Feet): 20 Feet Assistive device: Rolling walker (2 wheeled)   Gait velocity: decreased   General Gait Details: decreased B step length/foot clearance/heelstrike; mildly unsteady and "shaky"; vc's required for walker use and navigation  Stairs            Wheelchair Mobility    Modified Rankin (Stroke Patients Only)       Balance Overall balance assessment: Needs assistance Sitting-balance support: Bilateral upper extremity supported;Feet supported Sitting balance-Leahy Scale: Fair     Standing balance support: Bilateral upper extremity supported (on RW) Standing balance-Leahy Scale: Fair                               Pertinent Vitals/Pain Pain Assessment: 0-10 Pain Score: 3  Pain Location: low back with movement Pain Descriptors / Indicators: Aching;Sore Pain Intervention(s): Limited activity within patient's tolerance;Monitored during session;Premedicated before session;Repositioned  Vitals stable and WFL throughout treatment session on 2 L/min O2 via nasal cannula.    Home Living Family/patient expects to be discharged to:: Private residence Living Arrangements: Spouse/significant other Available Help at Discharge: Family Type of Home: House Home Access: Stairs to enter Entrance Stairs-Rails: Left Entrance Stairs-Number of Steps: 1 Home Layout: Two level;Able to live on main level with bedroom/bathroom Home Equipment: Walker - 4 wheels;Cane - single point;Shower seat;Grab bars - toilet;Grab bars - tub/shower (Handi-cap shower)      Prior Function Level of Independence: Needs assistance   Gait / Transfers Assistance  Needed: Pt used 4ww with seat for ambulation.  ADL's / Homemaking Assistance Needed: Pt has caregiver 9-12am to assist for sponge baths, dressing, etc.  Comments: Pt reports at least 2 falls in the past 6  months.     Hand Dominance        Extremity/Trunk Assessment   Upper Extremity Assessment: Generalized weakness           Lower Extremity Assessment: Generalized weakness         Communication   Communication: HOH  Cognition Arousal/Alertness: Awake/alert Behavior During Therapy: WFL for tasks assessed/performed Overall Cognitive Status: Within Functional Limits for tasks assessed                      General Comments  Pt agreeable to PT session.  Pt's son and daughter-in-law present during session.    Exercises  Increase time and cueing for bed mobility, transfers, and functional mobility.      Assessment/Plan    PT Assessment Patient needs continued PT services  PT Diagnosis Difficulty walking;Generalized weakness;Acute pain   PT Problem List Decreased strength;Decreased activity tolerance;Decreased balance;Decreased mobility;Decreased knowledge of use of DME;Decreased knowledge of precautions;Pain  PT Treatment Interventions DME instruction;Gait training;Stair training;Functional mobility training;Therapeutic activities;Therapeutic exercise;Balance training;Patient/family education   PT Goals (Current goals can be found in the Care Plan section) Acute Rehab PT Goals Patient Stated Goal: to have less pain PT Goal Formulation: With patient Time For Goal Achievement: 03/21/16 Potential to Achieve Goals: Fair    Frequency 7X/week   Barriers to discharge Decreased caregiver support Pt's family reports pt's husband is not able to physically assist much.    Co-evaluation               End of Session Equipment Utilized During Treatment: Gait belt;Oxygen (2 L/min via nasal cannula) Activity Tolerance: Patient limited by fatigue Patient left: in chair;with call bell/phone within reach;with chair alarm set;with family/visitor present;with SCD's reapplied Nurse Communication: Mobility status;Precautions    Functional Assessment Tool Used: AM-PAC  without stairs Functional Limitation: Mobility: Walking and moving around Mobility: Walking and Moving Around Current Status (Z6109): At least 60 percent but less than 80 percent impaired, limited or restricted Mobility: Walking and Moving Around Goal Status [phone]): At least 1 percent but less than 20 percent impaired, limited or restricted    Time: 1201-1239 PT Time Calculation (min) (ACUTE ONLY): 38 min   Charges:   PT Evaluation $PT Eval Moderate Complexity: 1 Procedure PT Treatments $Therapeutic Activity: 8-22 mins   PT G Codes:   PT G-Codes **NOT FOR INPATIENT CLASS** Functional Assessment Tool Used: AM-PAC without stairs Functional Limitation: Mobility: Walking and moving around Mobility: Walking and Moving Around Current Status (U9811): At least 60 percent but less than 80 percent impaired, limited or restricted Mobility: Walking and Moving Around Goal Status [phone]): At least 1 percent but less than 20 percent impaired, limited or restricted    Hendricks Limes 03/07/2016, 2:06 PM Hendricks Limes, PT [phone]

## 2016-03-07 NOTE — Care Management Note (Signed)
Case Management Note  Patient Details  Name: Courtney Pruitt MRN: [id number] Date of Birth: Jul 10, 1926  Subjective/Objective:                  Met with patient, her son, and her daughter in law to discuss discharge planning. She is from home with her elderly husband which is a concern for family. Patient has a  rollator but according to our conversation may not be the safest DME for her right now. Patient seems to indicate that his "rolls out from under her". She has [facility] [employer] under private pay that comes out a few hours per week per son. Son had multiple questions about MOON letter which has been explained. They agrees to SNF if needed. O2 is acute. Recent admission for UTI and AMS at [facility] per son. Her PCP is Dr. Emily Filbert.They deny problems with transportation or obtaining Rx. Apparently patient/husband rely on son for transportation.  Action/Plan: List of home health agencies left with patient/family. PT evaluation pending. RNCM will continue to follow.   Expected Discharge Date:                  Expected Discharge Plan:     In-House Referral:     Discharge planning Services  CM Consult  Post Acute Care Choice:  Home Health, Durable Medical Equipment Choice offered to:  Patient, Adult Children  DME Arranged:  Walker rolling DME Agency:  [place] Arranged:  PT, RN, NA [place] Agency:   (list provided)  Status of Service:  In process, will continue to follow  Medicare Important Message Given:    Date Medicare IM Given:    Medicare IM give by:    Date Additional Medicare IM Given:    Additional Medicare Important Message give by:     If discussed at [place] of Stay Meetings, dates discussed:    Additional Comments:  Marshell Garfinkel, RN 03/07/2016, 11:01 AM

## 2016-03-07 NOTE — Clinical Social Work Note (Signed)
Clinical Social Work Assessment  Patient Details  Name: Courtney Pruitt MRN: [id number] Date of Birth: [birth year]  Date of referral:  03/07/16               Reason for consult:  Facility Placement                Permission sought to share information with:  Chartered certified accountant granted to share information::  Yes, Verbal Permission Granted  Name::      [place]::   [place]   Relationship::     Contact Information:     Housing/Transportation Living arrangements for the past 2 months:  [place] of Information:  Patient, Adult Children Patient Interpreter Needed:  None Criminal Activity/Legal Involvement Pertinent to Current Situation/Hospitalization:  No - Comment as needed Significant Relationships:  Adult Children Lives with:  Spouse Do you feel safe going back to the place where you live?  Yes Need for family participation in patient care:  Yes (Comment)  Care giving concerns:  Patient lives with her husband Clance Boll in [place].    Social Worker assessment / plan: [place] representative (CSW) received verbal consult from PT that recommendation is SNF. Per MD patient will likely be ready for D/C tomorrow. CSW met with patient and her son Fara Olden and daughter in law McKenzie were at bedside. CSW introduced self and explained role of CSW department. Patient was alert and oriented and sitting up in the chair. Per patient she lives with her husband in [place] and her son and daughter in law also live in [place]. CSW explained that PT is recommending SNF. Per patient has been to rehab several years ago for knee replacements. CSW explained SNF process and that [facility] will have to approve SNF. Patient and son are agreeable for SNF search.   FL2 complete and faxed out. [facility] Medicare authorization has been started. CSW presented bed offers. Patient chose [facility]. Kim admissions coordinator at [facility] is aware  of accepted bed offer. CSW will continue to follow and assist as needed.    Employment status:  Disabled (Comment on whether or not currently receiving Disability), Retired Nurse, adult PT Recommendations:  [place] / Referral to community resources:  [place]  Patient/Family's Response to care:  Patient and son are agreeable for patient to go to [place].   Patient/Family's Understanding of and Emotional Response to Diagnosis, Current Treatment, and Prognosis:  Patient and son were pleasant and thanked CSW for visit.   Emotional Assessment Appearance:  Appears stated age Attitude/Demeanor/Rapport:    Affect (typically observed):  Accepting, Adaptable, Pleasant Orientation:  Oriented to Self, Oriented to Place, Oriented to  Time, Oriented to Situation Alcohol / Substance use:  Not Applicable Psych involvement (Current and /or in the community):  No (Comment)  Discharge Needs  Concerns to be addressed:  Discharge Planning Concerns Readmission within the last 30 days:  No Current discharge risk:  Dependent with Mobility Barriers to Discharge:  Continued Medical Work up   Loralyn Freshwater, LCSW 03/07/2016, 3:14 PM

## 2016-03-07 NOTE — Progress Notes (Signed)
Patient ID: Courtney Pruitt, female   DOB: Feb 28, 1926, 82 y.o.   MRN: [id number] [facility] [place] [facility]   PATIENT NAME: Courtney Pruitt    MR#:  [id number]  DATE OF BIRTH:  [birth year]  SUBJECTIVE:  Still severe back pain and weakness.  REVIEW OF SYSTEMS:   Review of Systems  Constitutional: Negative for fever, chills and weight loss.  HENT: Negative for ear discharge, ear pain and nosebleeds.   Eyes: Negative for blurred vision, pain and discharge.  Respiratory: Negative for sputum production, shortness of breath, wheezing and stridor.   Cardiovascular: Negative for chest pain, palpitations, orthopnea and PND.  Gastrointestinal: Negative for nausea, vomiting, abdominal pain and diarrhea.  Genitourinary: Negative for urgency and frequency.  Musculoskeletal: Positive for back pain. Negative for joint pain.  Neurological: Positive for weakness. Negative for sensory change, speech change and focal weakness.  Psychiatric/Behavioral: Negative for depression and hallucinations. The patient is not nervous/anxious.   All other systems reviewed and are negative.  Tolerating Diet:yes Tolerating PT: pending  DRUG ALLERGIES:   Allergies  Allergen Reactions  . Ace Inhibitors Other (See Comments)    Reaction: Unknown  . Amiodarone Other (See Comments)    Reaction:  Pt has difficulty balancing   . Clindamycin/Lincomycin Other (See Comments)    Reaction:  Difficulty swallowing   . Codeine Other (See Comments)    Reaction: GI upset   . Fluorescein Itching and Other (See Comments)    Reaction:  Throat tightness    . Iodinated Diagnostic Agents Itching and Other (See Comments)    Reaction:  Throat tightness    . Lovastatin Other (See Comments)    Reaction:  Muscle pain   . Nifedipine Other (See Comments)    Reaction:  Unknown   . Cefpodoxime Rash  . Cinoxacin Rash  . Ciprofloxacin Rash  . Doxazosin Rash  . Indapamide Rash  . Nitrofurantoin Rash  .  Penicillins Rash and Other (See Comments)    Has patient had a PCN reaction causing immediate rash, facial/tongue/throat swelling, SOB or lightheadedness with hypotension: No Has patient had a PCN reaction causing severe rash involving mucus membranes or skin necrosis: No Has patient had a PCN reaction that required hospitalization No Has patient had a PCN reaction occurring within the last 10 years: No If all of the above answers are "NO", then may proceed with Cephalosporin use.  . Quinine Derivatives Rash  . Sulfa Antibiotics Rash  . Telithromycin Rash    VITALS:  Blood pressure 116/50, pulse 60, temperature 97.5 F (36.4 C), temperature source Axillary, resp. rate 18, height 5\' 2"  (1.575 m), weight 62.197 kg (137 lb 1.9 oz), SpO2 98 %.  PHYSICAL EXAMINATION:   Physical Exam  GENERAL:  82 y.o.-year-old patient lying in the bed with no acute distress. She looks fragile. EYES: Pupils equal, round, reactive to light and accommodation. No scleral icterus. Extraocular muscles intact.  HEENT: Head atraumatic, normocephalic. Oropharynx and nasopharynx clear.  NECK:  Supple, no jugular venous distention. No thyroid enlargement, no tenderness.  LUNGS: Normal breath sounds bilaterally, no wheezing, rales, rhonchi. No use of accessory muscles of respiration.  CARDIOVASCULAR: S1, S2 normal. No murmurs, rubs, or gallops.  ABDOMEN: Soft, nontender, nondistended. Bowel sounds present. No organomegaly or mass.  EXTREMITIES: No cyanosis, clubbing or edema b/l.    NEUROLOGIC: Cranial nerves II through XII are intact. No focal Motor or sensory deficits b/l.   PSYCHIATRIC:  patient is alert and oriented x  3.  SKIN: No obvious rash, lesion, or ulcer.   LABORATORY PANEL:  CBC  Recent Labs Lab 03/05/16 2301  WBC 6.6  HGB 12.1  HCT 35.0  PLT 217    Chemistries   Recent Labs Lab 03/05/16 2301  NA 127*  K 3.9  CL 94*  CO2 25  GLUCOSE 103*  BUN 23*  CREATININE 0.76  CALCIUM 9.0    Cardiac Enzymes No results for input(s): TROPONINI in the last 168 hours. RADIOLOGY:  Dg Thoracic Spine 2 View  03/05/2016  CLINICAL DATA:  Fall with back pain.  Initial encounter. EXAM: THORACIC SPINE 2 VIEWS COMPARISON:  04/17/2015 chest x-ray FINDINGS: There is no evidence of thoracic spine acute fracture or subluxation. Exaggerated thoracic kyphosis and bulky lower thoracic spondylosis. Lumbar findings reported separately. Chronic bilateral interstitial coarsening.  Biventricular ICD/pacer. IMPRESSION: No acute finding at the thoracic levels. Electronically Signed   By: Marnee SpringJonathon  Watts M.D.   On: 03/05/2016 22:07   Dg Lumbar Spine 2-3 Views  03/05/2016  CLINICAL DATA:  Fall with lower back pain. Initial encounter. EXAM: LUMBAR SPINE - 2-3 VIEW COMPARISON:  Abdominal CT 03/21/2013 FINDINGS: L2 body fracture with superior endplate compression causing ~50% height loss at maximum. Fracture age is indeterminate. No retropulsion or subluxation. L3 superior endplate and L4 inferior endplate compression fractures are chronic and stable from prior. No evidence of traumatic malalignment. Chronic L4-5 mild anterolisthesis associated with facet arthropathy. Advanced L5-S1 degenerative disc narrowing. Upper lumbar spondylotic endplate spurring. Osteopenia. IMPRESSION: 1. Age-indeterminate L2 compression fracture with ~50% height loss. Negative for retropulsion. 2. Remote L3 and L4 compression fractures. Electronically Signed   By: Marnee SpringJonathon  Watts M.D.   On: 03/05/2016 22:10   Ct Head Wo Contrast  03/05/2016  CLINICAL DATA:  Larey SeatFell backwards while walking. Hit head, and landed on back. Concern for cervical spine injury. Initial encounter. EXAM: CT HEAD WITHOUT CONTRAST CT CERVICAL SPINE WITHOUT CONTRAST TECHNIQUE: Multidetector CT imaging of the head and cervical spine was performed following the standard protocol without intravenous contrast. Multiplanar CT image reconstructions of the cervical spine were also  generated. COMPARISON:  CT of the head performed 07/27/2012 FINDINGS: CT HEAD FINDINGS There is no evidence of acute infarction, mass lesion, or intra- or extra-axial hemorrhage on CT. Prominence of the ventricles and sulci reflects mild to moderate cortical volume loss. Mild cerebellar atrophy is noted. Mild periventricular white matter change likely reflects small ischemic microangiopathy. The brainstem and fourth ventricle are within normal limits. The basal ganglia are unremarkable in appearance. The cerebral hemispheres demonstrate grossly normal gray-white differentiation. No mass effect or midline shift is seen. There is no evidence of fracture; visualized osseous structures are unremarkable in appearance. The visualized portions of the orbits are within normal limits. The paranasal sinuses and mastoid air cells are well-aerated. No significant soft tissue abnormalities are seen. CT CERVICAL SPINE FINDINGS There is no evidence of acute fracture or subluxation. There is mild grade 1 anterolisthesis of C3 on C4, and multilevel disc space narrowing along the cervical spine, with scattered anterior and posterior disc osteophyte complexes. Underlying facet disease is noted. Vertebral bodies demonstrate normal height. Prevertebral soft tissues are within normal limits. A heterogeneous 3.4 cm mass is noted at the right thyroid lobe, with minimal calcification. A small right-sided pleural effusion is noted. Mild interstitial prominence is noted at the lung apices. Prominent calcification is seen at the carotid bifurcations bilaterally. IMPRESSION: 1. No evidence of traumatic intracranial injury or fracture. 2. No evidence of  fracture or subluxation along the cervical spine. 3. Mild to moderate cortical volume loss and scattered small vessel ischemic microangiopathy. 4. Mild degenerative change along the cervical spine. 5. Heterogeneous 3.4 cm mass at the right thyroid lobe, with minimal calcification. Recommend  further evaluation with thyroid ultrasound. If patient is clinically hyperthyroid, consider nuclear medicine thyroid uptake and scan. 6. Small right-sided pleural effusion noted. Mild interstitial prominence at the lung apices. 7. Prominent calcification at the carotid bifurcations bilaterally. Carotid ultrasound would be helpful for further evaluation, when and as deemed clinically appropriate. Electronically Signed   By: Roanna Raider M.D.   On: 03/05/2016 21:59   Ct Cervical Spine Wo Contrast  03/05/2016  CLINICAL DATA:  Larey Seat backwards while walking. Hit head, and landed on back. Concern for cervical spine injury. Initial encounter. EXAM: CT HEAD WITHOUT CONTRAST CT CERVICAL SPINE WITHOUT CONTRAST TECHNIQUE: Multidetector CT imaging of the head and cervical spine was performed following the standard protocol without intravenous contrast. Multiplanar CT image reconstructions of the cervical spine were also generated. COMPARISON:  CT of the head performed 07/27/2012 FINDINGS: CT HEAD FINDINGS There is no evidence of acute infarction, mass lesion, or intra- or extra-axial hemorrhage on CT. Prominence of the ventricles and sulci reflects mild to moderate cortical volume loss. Mild cerebellar atrophy is noted. Mild periventricular white matter change likely reflects small ischemic microangiopathy. The brainstem and fourth ventricle are within normal limits. The basal ganglia are unremarkable in appearance. The cerebral hemispheres demonstrate grossly normal gray-white differentiation. No mass effect or midline shift is seen. There is no evidence of fracture; visualized osseous structures are unremarkable in appearance. The visualized portions of the orbits are within normal limits. The paranasal sinuses and mastoid air cells are well-aerated. No significant soft tissue abnormalities are seen. CT CERVICAL SPINE FINDINGS There is no evidence of acute fracture or subluxation. There is mild grade 1 anterolisthesis of  C3 on C4, and multilevel disc space narrowing along the cervical spine, with scattered anterior and posterior disc osteophyte complexes. Underlying facet disease is noted. Vertebral bodies demonstrate normal height. Prevertebral soft tissues are within normal limits. A heterogeneous 3.4 cm mass is noted at the right thyroid lobe, with minimal calcification. A small right-sided pleural effusion is noted. Mild interstitial prominence is noted at the lung apices. Prominent calcification is seen at the carotid bifurcations bilaterally. IMPRESSION: 1. No evidence of traumatic intracranial injury or fracture. 2. No evidence of fracture or subluxation along the cervical spine. 3. Mild to moderate cortical volume loss and scattered small vessel ischemic microangiopathy. 4. Mild degenerative change along the cervical spine. 5. Heterogeneous 3.4 cm mass at the right thyroid lobe, with minimal calcification. Recommend further evaluation with thyroid ultrasound. If patient is clinically hyperthyroid, consider nuclear medicine thyroid uptake and scan. 6. Small right-sided pleural effusion noted. Mild interstitial prominence at the lung apices. 7. Prominent calcification at the carotid bifurcations bilaterally. Carotid ultrasound would be helpful for further evaluation, when and as deemed clinically appropriate. Electronically Signed   By: Roanna Raider M.D.   On: 03/05/2016 21:59   Ct Lumbar Spine Wo Contrast  03/05/2016  CLINICAL DATA:  Status post fall, with lower back pain. Initial encounter. EXAM: CT LUMBAR SPINE WITHOUT CONTRAST TECHNIQUE: Multidetector CT imaging of the lumbar spine was performed without intravenous contrast administration. Multiplanar CT image reconstructions were also generated. COMPARISON:  CT of the abdomen and pelvis performed 03/21/2013, and lumbar spine radiographs performed earlier today at 9:34 p.m. FINDINGS: There is a compression  deformity involving vertebral body L2, with nearly 70% loss of  height. This is new from 2014, though of indeterminate age. Associated fractures are noted through the anterior osteophytes at the inferior endplate of L1 and superior endplate of L2. There is no evidence of extension through the posterior elements. There is underlying chronic compression deformity involving the superior endplate of L3 and inferior endplate of L4, relatively stable from 2014, and mild sclerotic change at the inferior endplate of L1. Underlying facet disease is noted along the lumbar spine. Vacuum phenomenon is noted along the lower lumbar spine. A small to moderate right-sided pleural effusion is noted. Diffuse calcification is seen along the abdominal aorta and its branches. Mild atrophy is noted with regard to the paraspinal musculature. The visualized soft tissues are otherwise grossly unremarkable. IMPRESSION: 1. New compression deformity involving vertebral body L2, with nearly 70% loss of height, of indeterminate age. Associated fractures through the anterior osteophytes at the inferior endplate of L1 and superior endplate of L2. No evidence of extension through the posterior elements. 2. Chronic compression deformity involving the superior endplate of L3 and inferior endplate of L4, relatively stable from 2014. Mild degenerative change along the lumbar spine. 3. Small to moderate right-sided pleural effusion. 4. Diffuse calcification along the abdominal aorta and its branches. Electronically Signed   By: Roanna Raider M.D.   On: 03/05/2016 23:26   ASSESSMENT AND PLAN:   82 year old female admitted for intractable pain 1. Intractable pain: Secondary to L-2 vertebral compression fracture.  Pain control and PT.  2. Compression fracture: L2 fracture; multiple chronic fractures. No spinal impingement. The patient has no neurologic deficits  The L2 fracture does not appear acute per Dr. Hyacinth Meeker.  3. Essential hypertension: Continue metoprolol 4. Atrial fibrillation: Rate controlled;  continue sotalol as well as warfarin 5. Osteoporosis: Continue calcium and vitamin D supplementation  * Chronic hyponatremia. Sodium level is low but Stable.  PT evaluation suggests needs to skilled nursing facility placement. Case discussed with Care Management/Social Worker. Management plans discussed with the patient, her son and they are in agreement.  CODE STATUS: full DVT Prophylaxis:warfarin  TOTAL TIME TAKING CARE OF THIS PATIENT: 35 minutes.  >50% time spent on counselling and coordination of care  POSSIBLE D/C IN 1-2 dAYS, DEPENDING ON CLINICAL CONDITION.  Note: This dictation was prepared with Dragon dictation along with smaller phrase technology. Any transcriptional errors that result from this process are unintentional.  Shaune Pollack M.D on 03/07/2016 at 2:54 PM  Between 7am to 6pm - Pager - [phone]  After 6pm go to [email or web address] - password [email or web address] [facility]  [place] [facility]  Office  [phone]  CC: Primary care physician; Danella Penton, MD

## 2016-03-08 ENCOUNTER — Encounter
Admission: RE | Admit: 2016-03-08 | Discharge: 2016-03-08 | Disposition: A | Discharge status: Home / Self Care | Payer: Medicare Other | Source: Ambulatory Visit | Attending: Internal Medicine | Admitting: Internal Medicine

## 2016-03-08 DIAGNOSIS — I4891 Unspecified atrial fibrillation: Secondary | ICD-10-CM | POA: Insufficient documentation

## 2016-03-08 DIAGNOSIS — Z7901 Long term (current) use of anticoagulants: Secondary | ICD-10-CM | POA: Insufficient documentation

## 2016-03-08 LAB — BASIC METABOLIC PANEL
Anion gap: 7 (ref 5–15)
BUN: 14 mg/dL (ref 6–20)
CALCIUM: 8.8 mg/dL — AB (ref 8.9–10.3)
CO2: 30 mmol/L (ref 22–32)
CREATININE: 0.76 mg/dL (ref 0.44–1.00)
Chloride: 93 mmol/L — ABNORMAL LOW (ref 101–111)
GFR calc Af Amer: >60 mL/min (ref >60)
Glucose, Bld: 88 mg/dL (ref 65–99)
Potassium: 3.8 mmol/L (ref 3.5–5.1)
Sodium: 130 mmol/L — ABNORMAL LOW (ref 135–145)

## 2016-03-08 LAB — PROTIME-INR
INR: 2.96
Prothrombin Time: 30.3 seconds — ABNORMAL HIGH (ref 11.4–15.0)

## 2016-03-08 MED ORDER — VALACYCLOVIR HCL 500 MG PO TABS
1000.0000 mg | ORAL_TABLET | Freq: Two times a day (BID) | ORAL | Status: DC
  Administered 2016-03-08: 1000 mg via ORAL
  Filled 2016-03-08 (×2): qty 2

## 2016-03-08 MED ORDER — OXYCODONE-ACETAMINOPHEN 5-325 MG PO TABS: 1.0000 | ORAL_TABLET | Freq: Four times a day (QID) | ORAL | Status: AC | PRN

## 2016-03-08 MED ORDER — CEFUROXIME AXETIL 500 MG PO TABS: 250.0000 mg | ORAL_TABLET | Freq: Two times a day (BID) | ORAL | Status: DC

## 2016-03-08 MED ORDER — DOCUSATE SODIUM 100 MG PO CAPS: 100.0000 mg | ORAL_CAPSULE | Freq: Two times a day (BID) | ORAL | Status: AC

## 2016-03-08 NOTE — Clinical Social Work Note (Signed)
Pt is ready for discharge today and [facility]. Facility has received discharge information and is ready to admit pt. Pt and family are aware and agreeable to discharge plan. RN will call report and EMS will provide transportation. CSW is signing off as no further needs identified.   Dede QuerySarah Cliff Damiani, MSW, LCSW  Clinical Social Worker  [phone]

## 2016-03-08 NOTE — Discharge Instructions (Signed)
Heart healthy diet. Activity as tolerated. Fall precaution. Home O2 [place] 2L

## 2016-03-08 NOTE — Progress Notes (Signed)
ANTICOAGULATION CONSULT NOTE - Initial Consult  Pharmacy Consult for warfarin Indication: atrial fibrillation  Allergies  Allergen Reactions  . Ace Inhibitors Other (See Comments)    Reaction: Unknown  . Amiodarone Other (See Comments)    Reaction:  Pt has difficulty balancing   . Clindamycin/Lincomycin Other (See Comments)    Reaction:  Difficulty swallowing   . Codeine Other (See Comments)    Reaction: GI upset   . Fluorescein Itching and Other (See Comments)    Reaction:  Throat tightness    . Iodinated Diagnostic Agents Itching and Other (See Comments)    Reaction:  Throat tightness    . Lovastatin Other (See Comments)    Reaction:  Muscle pain   . Nifedipine Other (See Comments)    Reaction:  Unknown   . Cefpodoxime Rash  . Cinoxacin Rash  . Ciprofloxacin Rash  . Doxazosin Rash  . Indapamide Rash  . Nitrofurantoin Rash  . Penicillins Rash and Other (See Comments)    Has patient had a PCN reaction causing immediate rash, facial/tongue/throat swelling, SOB or lightheadedness with hypotension: No Has patient had a PCN reaction causing severe rash involving mucus membranes or skin necrosis: No Has patient had a PCN reaction that required hospitalization No Has patient had a PCN reaction occurring within the last 10 years: No If all of the above answers are "NO", then may proceed with Cephalosporin use.  . Quinine Derivatives Rash  . Sulfa Antibiotics Rash  . Telithromycin Rash    Patient Measurements: Height: 5\' 2"  (157.5 cm) Weight: 144 lb 1.6 oz (65.363 kg) IBW/kg (Calculated) : 50.1 Heparin Dosing Weight:   Vital Signs: Temp: 99.1 F (37.3 C) (05/02 0801) Temp Source: Oral (05/02 0801) BP: 168/74 mmHg (05/02 0917) Pulse Rate: 78 (05/02 0917)  Labs:  Recent Labs  03/05/16 2301 03/07/16 0336 03/08/16 0426  HGB 12.1  --   --   HCT 35.0  --   --   PLT 217  --   --   LABPROT 19.3* 24.8* 30.3*  INR 1.62 2.27 2.96  CREATININE 0.76  --  0.76     Estimated Creatinine Clearance: 44 mL/min (by C-G formula based on Cr of 0.76).   Assessment: 89 yof cc fall. Patient reports hitting her head. No evidence of hemorrhage on CT. Pharmacy consulted for warfarin dosing.  4/29 - INR: 1.62, warfarin 5mg  given 4/30 - INR: 2.27, 3.5mg  given 5/1 - INR: 2.96  Goal of Therapy:  INR 2-3 Monitor platelets by anticoagulation protocol: Yes   Plan:  Will resume home regimen of warfarin 2.5 mg po daily Monday through Friday, and 5 mg po on Saturday and Sunday. Pharmacy will continue to follow and adjust as needed to maintain INR 2 to 3.   Garlon HatchetJody Verble Styron, PharmD Clinical Pharmacist   03/08/2016,9:26 AM

## 2016-03-08 NOTE — Discharge Planning (Addendum)
Pt IV removed. Report called to Anell BarrShirley Albert, LPN.  Told of suggest FU appts, packet and scripts sent to facility. EMS was notified and is transporting to facility.  RN assessment and VS revealed stability for DC. Pain meds given just prior to DC.

## 2016-03-08 NOTE — Discharge Summary (Addendum)
[facility] Physicians -  at [facility]   PATIENT NAME: Courtney Pruitt    MR#:  [id number]  DATE OF BIRTH:  May 19, 1926  DATE OF ADMISSION:  03/05/2016 ADMITTING PHYSICIAN: Arnaldo Natal, MD  DATE OF DISCHARGE: 03/08/2016 PRIMARY CARE PHYSICIAN: Danella Penton, MD    ADMISSION DIAGNOSIS:  Compression fracture [T14.8] Fall, initial encounter [W19.XXXA]   DISCHARGE DIAGNOSIS:    Intractable back pain Compression fracture: L2 fracture; multiple chronic fractures  Chronic hyponatremia SECONDARY DIAGNOSIS:   Past Medical History  Diagnosis Date  . Hypertension   . CHF (congestive heart failure) (HCC)   . Hyperlipidemia   . Atrial fibrillation [facility])     HOSPITAL COURSE:   82 year old female admitted for intractable pain 1. Intractable back pain: Secondary to L-2 vertebral compression fracture.  Pain control and PT.  2. Compression fracture: L2 fracture; multiple chronic fractures. No spinal impingement. The patient has no neurologic deficits The L2 fracture does not appear acute per Dr. Hyacinth Meeker.  3. Essential hypertension: Continue metoprolol 4. Atrial fibrillation: Rate controlled; continue sotalol as well as warfarin 5. Osteoporosis: Continue calcium and vitamin D supplementation  * Chronic hyponatremia. Sodium level is low but Stable.  Hypoxia. The patient O2 saturation decreased to 87% without oxygen. She needed home oxygen 2 L by nasal cannula.  PT evaluation suggests needs to skilled nursing facility placement.  DISCHARGE CONDITIONS:   Stable, discharge to SNF today.  CONSULTS OBTAINED:  Treatment Team:  Deeann Saint, MD  DRUG ALLERGIES:   Allergies  Allergen Reactions  . Ace Inhibitors Other (See Comments)    Reaction: Unknown  . Amiodarone Other (See Comments)    Reaction:  Pt has difficulty balancing   . Clindamycin/Lincomycin Other (See Comments)    Reaction:  Difficulty swallowing   . Codeine Other (See Comments)   Reaction: GI upset   . Fluorescein Itching and Other (See Comments)    Reaction:  Throat tightness    . Iodinated Diagnostic Agents Itching and Other (See Comments)    Reaction:  Throat tightness    . Lovastatin Other (See Comments)    Reaction:  Muscle pain   . Nifedipine Other (See Comments)    Reaction:  Unknown   . Cefpodoxime Rash  . Cinoxacin Rash  . Ciprofloxacin Rash  . Doxazosin Rash  . Indapamide Rash  . Nitrofurantoin Rash  . Penicillins Rash and Other (See Comments)    Has patient had a PCN reaction causing immediate rash, facial/tongue/throat swelling, SOB or lightheadedness with hypotension: No Has patient had a PCN reaction causing severe rash involving mucus membranes or skin necrosis: No Has patient had a PCN reaction that required hospitalization No Has patient had a PCN reaction occurring within the last 10 years: No If all of the above answers are "NO", then may proceed with Cephalosporin use.  . Quinine Derivatives Rash  . Sulfa Antibiotics Rash  . Telithromycin Rash    DISCHARGE MEDICATIONS:   Current Discharge Medication List    START taking these medications   Details  docusate sodium (COLACE) 100 MG capsule Take 1 capsule (100 mg total) by mouth 2 (two) times daily. Qty: 10 capsule, Refills: 0    oxyCODONE-acetaminophen (PERCOCET/ROXICET) 5-325 MG tablet Take 1 tablet by mouth every 6 (six) hours as needed for moderate pain. Qty: 16 tablet, Refills: 0      CONTINUE these medications which have NOT CHANGED   Details  acetaminophen (TYLENOL) 500 MG tablet Take 1,000 mg by mouth  every 4 (four) hours as needed for mild pain or headache.     albuterol (PROVENTIL HFA;VENTOLIN HFA) 108 (90 BASE) MCG/ACT inhaler Inhale 2 puffs into the lungs every 6 (six) hours as needed for wheezing or shortness of breath. Qty: 1 Inhaler, Refills: 2    calcium carbonate 1250 MG capsule Take 1,250 mg by mouth 2 (two) times daily with a meal.    cefUROXime (CEFTIN) 250  MG tablet Take 250 mg by mouth 2 (two) times daily with a meal.    cetirizine (ZYRTEC) 10 MG tablet Take 10 mg by mouth daily.    Cranberry 500 MG CAPS Take 500 mg by mouth 2 (two) times daily.     furosemide (LASIX) 40 MG tablet Take 80 mg by mouth daily.    metoprolol tartrate (LOPRESSOR) 25 MG tablet Take 12.5-25 mg by mouth 2 (two) times daily. Pt takes one tablet in the morning and one-half tablet at night.    Multiple Vitamin (MULTIVITAMIN WITH MINERALS) TABS tablet Take 1 tablet by mouth daily.    potassium chloride (K-DUR,KLOR-CON) 10 MEQ tablet Take 20 mEq by mouth daily.     psyllium (METAMUCIL) 58.6 % packet Take 1 packet by mouth at bedtime.    simvastatin (ZOCOR) 10 MG tablet Take 10 mg by mouth at bedtime.    sotalol (BETAPACE) 80 MG tablet Take 80 mg by mouth daily.    valACYclovir (VALTREX) 1000 MG tablet Take 1,000 mg by mouth 2 (two) times daily.    vitamin C (ASCORBIC ACID) 500 MG tablet Take 500 mg by mouth daily.    !! warfarin (COUMADIN) 2.5 MG tablet Take 2.5 mg by mouth at bedtime. Pt takes this dose Monday-Friday.    !! warfarin (COUMADIN) 5 MG tablet Take 5 mg by mouth 2 (two) times a week. Pt takes this dose on Saturday and Sunday.     !! - Potential duplicate medications found. Please discuss with provider.       DISCHARGE INSTRUCTIONS:    If you experience worsening of your admission symptoms, develop shortness of breath, life threatening emergency, suicidal or homicidal thoughts you must seek medical attention immediately by calling 911 or calling your MD immediately  if symptoms less severe.  You Must read complete instructions/literature along with all the possible adverse reactions/side effects for all the Medicines you take and that have been prescribed to you. Take any new Medicines after you have completely understood and accept all the possible adverse reactions/side effects.   Please note  You were cared for by a hospitalist during your  hospital stay. If you have any questions about your discharge medications or the care you received while you were in the hospital after you are discharged, you can call the unit and asked to speak with the hospitalist on call if the hospitalist that took care of you is not available. Once you are discharged, your primary care physician will handle any further medical issues. Please note that NO REFILLS for any discharge medications will be authorized once you are discharged, as it is imperative that you return to your primary care physician (or establish a relationship with a primary care physician if you do not have one) for your aftercare needs so that they can reassess your need for medications and monitor your lab values.    Today   SUBJECTIVE    Better back pain.  VITAL SIGNS:  Blood pressure 168/74, pulse 78, temperature 99.1 F (37.3 C), temperature source Oral, resp. rate  18, height 5\' 2"  (1.575 m), weight 65.363 kg (144 lb 1.6 oz), SpO2 92 %.  I/O:   Intake/Output Summary (Last 24 hours) at 03/08/16 0936 Last data filed at 03/08/16 0810  Gross per 24 hour  Intake    240 ml  Output      0 ml  Net    240 ml    PHYSICAL EXAMINATION:  GENERAL:  82 y.o.-year-old patient lying in the bed with no acute distress.  EYES: Pupils equal, round, reactive to light and accommodation. No scleral icterus. Extraocular muscles intact.  HEENT: Head atraumatic, normocephalic. Oropharynx and nasopharynx clear.  NECK:  Supple, no jugular venous distention. No thyroid enlargement, no tenderness.  LUNGS: Normal breath sounds bilaterally, no wheezing, rales,rhonchi or crepitation. No use of accessory muscles of respiration.  CARDIOVASCULAR: S1, S2 normal. No murmurs, rubs, or gallops.  ABDOMEN: Soft, non-tender, non-distended. Bowel sounds present. No organomegaly or mass.  EXTREMITIES: No pedal edema, cyanosis, or clubbing.  NEUROLOGIC: Cranial nerves II through XII are intact. Muscle strength 4/5  in all extremities. Sensation intact. Gait not checked.  PSYCHIATRIC: The patient is alert and oriented x 3.  SKIN: No obvious rash, lesion, or ulcer.   DATA REVIEW:   CBC  Recent Labs Lab 03/05/16 2301  WBC 6.6  HGB 12.1  HCT 35.0  PLT 217    Chemistries   Recent Labs Lab 03/08/16 0426  NA 130*  K 3.8  CL 93*  CO2 30  GLUCOSE 88  BUN 14  CREATININE 0.76  CALCIUM 8.8*    Cardiac Enzymes No results for input(s): TROPONINI in the last 168 hours.  Microbiology Results  Results for orders placed or performed during the hospital encounter of 02/22/16  Urine culture     Status: None   Collection Time: 02/22/16  4:50 PM  Result Value Ref Range Status   Specimen Description URINE, RANDOM  Final   Special Requests NONE  Final   Culture NO GROWTH 1 DAY  Final   Report Status 02/24/2016 FINAL  Final    RADIOLOGY:  No results found.      Management plans discussed with the patient, family and they are in agreement.  CODE STATUS:     Code Status Orders        Start     Ordered   03/06/16 0112  Full code   Continuous     03/06/16 0112    Code Status History    Date Active Date Inactive Code Status Order ID Comments User Context   This patient has a current code status but no historical code status.      TOTAL TIME TAKING CARE OF THIS PATIENT: 28 minutes.    Shaune Pollack M.D on 03/08/2016 at 9:36 AM  Between 7am to 6pm - Pager - [phone]  After 6pm go to [email or web address] - password [email or web address] [facility]  [facility] [place]  Office  [phone]  CC: Primary care physician; Danella Penton, MD

## 2016-03-08 NOTE — Clinical Social Work Placement (Deleted)
   CLINICAL SOCIAL WORK PLACEMENT  NOTE  Date:  03/08/2016  Patient Details  Name: Courtney Pruitt MRN: [id number] Date of Birth: 01/13/1926  Clinical Social Work is seeking post-discharge placement for this patient at the Skilled  Nursing Facility level of care (*CSW will initial, date and re-position this form in  chart as items are completed):  Yes   Patient/family provided with [place] Clinical Social Work Department's list of facilities offering this level of care within the geographic area requested by the patient (or if unable, by the patient's family).  Yes   Patient/family informed of their freedom to choose among providers that offer the needed level of care, that participate in Medicare, Medicaid or managed care program needed by the patient, have an available bed and are willing to accept the patient.  Yes   Patient/family informed of [facility]'s ownership interest in [facility] and [facility], as well as of the fact that they are under no obligation to receive care at these facilities.  PASRR submitted to EDS on       PASRR number received on       Existing PASRR number confirmed on 03/07/16     FL2 transmitted to all facilities in geographic area requested by pt/family on 03/07/16     FL2 transmitted to all facilities within larger geographic area on       Patient informed that his/her managed care company has contracts with or will negotiate with certain facilities, including the following:        Yes   Patient/family informed of bed offers received.  Patient chooses bed at  ([facility] )     Physician recommends and patient chooses bed at  (SNF)    Patient to be transferred to [facility] on 03/08/16.  Patient to be transferred to facility by [facility]     Patient family notified on 03/08/16 of transfer.  Name of family member notified:  Pt's daughter     PHYSICIAN       Additional Comment:     _______________________________________________ Kalyn Dimattia, LCSW 03/08/2016, 2:12 PM  

## 2016-03-08 NOTE — Progress Notes (Signed)
PT Cancellation Note  Patient Details Name: Courtney Pruitt MRN: [id number] DOB: 07-21-1926   Cancelled Treatment:    Reason Eval/Treat Not Completed: Other (comment). Per nursing request pt attempted to be seen today before discharging to skilled nursing facility. Upon attempt family in room and notes pt has been extremely tired today. Pt notes she did not sleep much at all last night and complains of continued significant pain. In continuing conversation with pt and family it is noted that family did not request PT see pt before discharge and in fact feel pt is not up for PT today especially considering transferring to another facility. Spoke with nurse who requested PT treatment session to clarify family wishes and general PT protocol regarding a pt transferring to skilled nursing facilities for future reference.    Kristeen MissHeidi Elizabeth Bishop, [place]PT[place] 03/08/2016, 1:57 PM

## 2016-03-08 NOTE — Clinical Social Work Placement (Signed)
   CLINICAL SOCIAL WORK PLACEMENT  NOTE  Date:  03/08/2016  Patient Details  Name: Courtney Pruitt MRN: [id number] Date of Birth: December 20, 1925  Clinical Social Work is seeking post-discharge placement for this patient at the Skilled  Nursing Facility level of care (*CSW will initial, date and re-position this form in  chart as items are completed):  Yes   Patient/family provided with [place] Clinical Social Work Department's list of facilities offering this level of care within the geographic area requested by the patient (or if unable, by the patient's family).  Yes   Patient/family informed of their freedom to choose among providers that offer the needed level of care, that participate in Medicare, Medicaid or managed care program needed by the patient, have an available bed and are willing to accept the patient.  Yes   Patient/family informed of [facility] ownership interest [facility], as well as of the fact that they are under no obligation to receive care at these facilities.  PASRR submitted to EDS on       PASRR number received on       Existing PASRR number confirmed on 03/07/16     FL2 transmitted to all facilities in geographic area requested by pt/family on 03/07/16     FL2 transmitted to all facilities within larger geographic area on       Patient informed that his/her managed care company has contracts with or will negotiate with certain facilities, including the following:        Yes   Patient/family informed of bed offers received.  Patient chooses bed at  [facility] )     Physician recommends and patient chooses bed at  [facility](SNF)    [facility]  Patient to be transferred to facility by [facility]     Patient family notified on 03/08/16 of transfer.  Name of family member notified:  Pt's daughter     PHYSICIAN       Additional Comment:     _______________________________________________ Courtney QuerySarah Malisa Ruggiero, LCSW 03/08/2016, 2:12 PM

## 2016-03-10 DIAGNOSIS — Z7901 Long term (current) use of anticoagulants: Secondary | ICD-10-CM | POA: Diagnosis not present

## 2016-03-10 DIAGNOSIS — I4891 Unspecified atrial fibrillation: Secondary | ICD-10-CM | POA: Diagnosis not present

## 2016-03-10 DIAGNOSIS — E871 Hypo-osmolality and hyponatremia: Secondary | ICD-10-CM | POA: Diagnosis present

## 2016-03-10 LAB — COMPREHENSIVE METABOLIC PANEL
ALT: 13 U/L — ABNORMAL LOW (ref 14–54)
ANION GAP: 9 (ref 5–15)
AST: 24 U/L (ref 15–41)
Albumin: 2.9 g/dL — ABNORMAL LOW (ref 3.5–5.0)
Alkaline Phosphatase: 67 U/L (ref 38–126)
BILIRUBIN TOTAL: 0.9 mg/dL (ref 0.3–1.2)
BUN: 11 mg/dL (ref 6–20)
CHLORIDE: 83 mmol/L — AB (ref 101–111)
CO2: 28 mmol/L (ref 22–32)
Calcium: 8.7 mg/dL — ABNORMAL LOW (ref 8.9–10.3)
Creatinine, Ser: 0.69 mg/dL (ref 0.44–1.00)
Glucose, Bld: 81 mg/dL (ref 65–99)
POTASSIUM: 3.5 mmol/L (ref 3.5–5.1)
Sodium: 120 mmol/L — ABNORMAL LOW (ref 135–145)
TOTAL PROTEIN: 6.8 g/dL (ref 6.5–8.1)

## 2016-03-10 LAB — CBC WITH DIFFERENTIAL/PLATELET
Basophils Absolute: 0 10*3/uL (ref 0–0.1)
EOS ABS: 0.2 10*3/uL (ref 0–0.7)
HCT: 36.2 % (ref 35.0–47.0)
HEMOGLOBIN: 12.6 g/dL (ref 12.0–16.0)
Lymphocytes Relative: 13 %
Lymphs Abs: 1.2 10*3/uL (ref 1.0–3.6)
MCH: 32.8 pg (ref 26.0–34.0)
MCHC: 34.7 g/dL (ref 32.0–36.0)
MCV: 94.5 fL (ref 80.0–100.0)
MONO ABS: 0.8 10*3/uL (ref 0.2–0.9)
Neutro Abs: 6.8 10*3/uL — ABNORMAL HIGH (ref 1.4–6.5)
PLATELETS: 97 10*3/uL — AB (ref 150–440)
RBC: 3.83 MIL/uL (ref 3.80–5.20)
RDW: 14.7 % — AB (ref 11.5–14.5)
WBC: 9 10*3/uL (ref 3.6–11.0)

## 2016-03-10 LAB — PROTIME-INR
INR: 2.87
PROTHROMBIN TIME: 29.6 s — AB (ref 11.4–15.0)

## 2016-03-11 DIAGNOSIS — I4891 Unspecified atrial fibrillation: Secondary | ICD-10-CM | POA: Diagnosis not present

## 2016-03-11 LAB — CBC WITH DIFFERENTIAL/PLATELET
Basophils Absolute: 0 10*3/uL (ref 0–0.1)
Basophils Relative: 1 %
Eosinophils Absolute: 0.2 10*3/uL (ref 0–0.7)
HCT: 32.4 % — ABNORMAL LOW (ref 35.0–47.0)
Hemoglobin: 11.3 g/dL — ABNORMAL LOW (ref 12.0–16.0)
LYMPHS ABS: 0.8 10*3/uL — AB (ref 1.0–3.6)
MCH: 32.7 pg (ref 26.0–34.0)
MCHC: 34.8 g/dL (ref 32.0–36.0)
MCV: 94 fL (ref 80.0–100.0)
MONO ABS: 0.6 10*3/uL (ref 0.2–0.9)
Monocytes Relative: 9 %
NEUTROS ABS: 5.1 10*3/uL (ref 1.4–6.5)
Neutrophils Relative %: 75 %
PLATELETS: 148 10*3/uL — AB (ref 150–440)
RBC: 3.44 MIL/uL — AB (ref 3.80–5.20)
RDW: 14.6 % — AB (ref 11.5–14.5)
WBC: 6.7 10*3/uL (ref 3.6–11.0)

## 2016-03-11 LAB — COMPREHENSIVE METABOLIC PANEL
ALBUMIN: 2.3 g/dL — AB (ref 3.5–5.0)
ALT: 12 U/L — AB (ref 14–54)
AST: 19 U/L (ref 15–41)
Alkaline Phosphatase: 52 U/L (ref 38–126)
Anion gap: 7 (ref 5–15)
BUN: 9 mg/dL (ref 6–20)
CHLORIDE: 96 mmol/L — AB (ref 101–111)
CO2: 25 mmol/L (ref 22–32)
CREATININE: 0.5 mg/dL (ref 0.44–1.00)
Calcium: 7.1 mg/dL — ABNORMAL LOW (ref 8.9–10.3)
GFR calc Af Amer: >60 mL/min (ref >60)
GFR calc non Af Amer: >60 mL/min (ref >60)
GLUCOSE: 87 mg/dL (ref 65–99)
POTASSIUM: 2.7 mmol/L — AB (ref 3.5–5.1)
Sodium: 128 mmol/L — ABNORMAL LOW (ref 135–145)
Total Bilirubin: 0.5 mg/dL (ref 0.3–1.2)
Total Protein: 5.3 g/dL — ABNORMAL LOW (ref 6.5–8.1)

## 2016-03-12 DIAGNOSIS — I4891 Unspecified atrial fibrillation: Secondary | ICD-10-CM | POA: Diagnosis not present

## 2016-03-12 LAB — CBC WITH DIFFERENTIAL/PLATELET
BASOS ABS: 0 10*3/uL (ref 0–0.1)
BASOS PCT: 0 %
EOS ABS: 0.1 10*3/uL (ref 0–0.7)
Eosinophils Relative: 1 %
HCT: 35.9 % (ref 35.0–47.0)
HEMOGLOBIN: 12.5 g/dL (ref 12.0–16.0)
Lymphocytes Relative: 5 %
Lymphs Abs: 0.5 10*3/uL — ABNORMAL LOW (ref 1.0–3.6)
MCH: 32.8 pg (ref 26.0–34.0)
MCHC: 34.9 g/dL (ref 32.0–36.0)
MCV: 93.8 fL (ref 80.0–100.0)
MONO ABS: 0.6 10*3/uL (ref 0.2–0.9)
Monocytes Relative: 6 %
NEUTROS ABS: 9.2 10*3/uL — AB (ref 1.4–6.5)
NEUTROS PCT: 88 %
Platelets: 130 10*3/uL — ABNORMAL LOW (ref 150–440)
RBC: 3.82 MIL/uL (ref 3.80–5.20)
RDW: 14.5 % (ref 11.5–14.5)
WBC: 10.4 10*3/uL (ref 3.6–11.0)

## 2016-03-12 LAB — BASIC METABOLIC PANEL
Anion gap: 8 (ref 5–15)
BUN: 9 mg/dL (ref 6–20)
CALCIUM: 8.5 mg/dL — AB (ref 8.9–10.3)
CO2: 25 mmol/L (ref 22–32)
CREATININE: 0.72 mg/dL (ref 0.44–1.00)
Chloride: 92 mmol/L — ABNORMAL LOW (ref 101–111)
GFR calc non Af Amer: >60 mL/min (ref >60)
Glucose, Bld: 97 mg/dL (ref 65–99)
Potassium: 4 mmol/L (ref 3.5–5.1)
SODIUM: 125 mmol/L — AB (ref 135–145)

## 2016-03-15 DIAGNOSIS — I4891 Unspecified atrial fibrillation: Secondary | ICD-10-CM | POA: Diagnosis not present

## 2016-03-15 LAB — CBC WITH DIFFERENTIAL/PLATELET
Basophils Absolute: 0.1 10*3/uL (ref 0–0.1)
EOS ABS: 0.3 10*3/uL (ref 0–0.7)
Eosinophils Relative: 3 %
HEMATOCRIT: 40.2 % (ref 35.0–47.0)
Hemoglobin: 13.6 g/dL (ref 12.0–16.0)
LYMPHS ABS: 1.3 10*3/uL (ref 1.0–3.6)
Lymphocytes Relative: 17 %
MCH: 32.6 pg (ref 26.0–34.0)
MCHC: 33.8 g/dL (ref 32.0–36.0)
MCV: 96.5 fL (ref 80.0–100.0)
MONO ABS: 0.7 10*3/uL (ref 0.2–0.9)
Neutro Abs: 5.6 10*3/uL (ref 1.4–6.5)
Neutrophils Relative %: 70 %
Platelets: 168 10*3/uL (ref 150–440)
RBC: 4.17 MIL/uL (ref 3.80–5.20)
RDW: 15 % — AB (ref 11.5–14.5)
WBC: 7.9 10*3/uL (ref 3.6–11.0)

## 2016-03-15 LAB — BASIC METABOLIC PANEL
Anion gap: 5 (ref 5–15)
BUN: 8 mg/dL (ref 6–20)
CO2: 33 mmol/L — AB (ref 22–32)
Calcium: 9.1 mg/dL (ref 8.9–10.3)
Chloride: 89 mmol/L — ABNORMAL LOW (ref 101–111)
Creatinine, Ser: 0.63 mg/dL (ref 0.44–1.00)
GFR calc Af Amer: >60 mL/min (ref >60)
GLUCOSE: 102 mg/dL — AB (ref 65–99)
POTASSIUM: 3.8 mmol/L (ref 3.5–5.1)
Sodium: 127 mmol/L — ABNORMAL LOW (ref 135–145)

## 2016-03-15 LAB — PROTIME-INR
INR: 3.46
PROTHROMBIN TIME: 34.1 s — AB (ref 11.4–15.0)

## 2016-03-17 DIAGNOSIS — I4891 Unspecified atrial fibrillation: Secondary | ICD-10-CM | POA: Diagnosis not present

## 2016-03-17 LAB — CBC WITH DIFFERENTIAL/PLATELET
BASOS ABS: 0 10*3/uL (ref 0–0.1)
Basophils Relative: 1 %
EOS PCT: 5 %
Eosinophils Absolute: 0.3 10*3/uL (ref 0–0.7)
HCT: 32.6 % — ABNORMAL LOW (ref 35.0–47.0)
HEMOGLOBIN: 11.3 g/dL — AB (ref 12.0–16.0)
LYMPHS PCT: 21 %
Lymphs Abs: 1.1 10*3/uL (ref 1.0–3.6)
MCH: 33 pg (ref 26.0–34.0)
MCHC: 34.8 g/dL (ref 32.0–36.0)
MCV: 94.8 fL (ref 80.0–100.0)
Monocytes Absolute: 0.5 10*3/uL (ref 0.2–0.9)
Monocytes Relative: 9 %
NEUTROS ABS: 3.4 10*3/uL (ref 1.4–6.5)
NEUTROS PCT: 64 %
PLATELETS: 194 10*3/uL (ref 150–440)
RBC: 3.44 MIL/uL — AB (ref 3.80–5.20)
RDW: 14.9 % — ABNORMAL HIGH (ref 11.5–14.5)
WBC: 5.3 10*3/uL (ref 3.6–11.0)

## 2016-03-17 LAB — BASIC METABOLIC PANEL
ANION GAP: 8 (ref 5–15)
BUN: 10 mg/dL (ref 6–20)
CALCIUM: 8.9 mg/dL (ref 8.9–10.3)
CHLORIDE: 82 mmol/L — AB (ref 101–111)
CO2: 33 mmol/L — ABNORMAL HIGH (ref 22–32)
CREATININE: 0.8 mg/dL (ref 0.44–1.00)
GFR calc non Af Amer: >60 mL/min (ref >60)
Glucose, Bld: 80 mg/dL (ref 65–99)
Potassium: 3.7 mmol/L (ref 3.5–5.1)
SODIUM: 123 mmol/L — AB (ref 135–145)

## 2016-03-17 LAB — PROTIME-INR
INR: 2.44
Prothrombin Time: 26.2 seconds — ABNORMAL HIGH (ref 11.4–15.0)

## 2016-03-22 DIAGNOSIS — I4891 Unspecified atrial fibrillation: Secondary | ICD-10-CM | POA: Diagnosis not present

## 2016-03-22 LAB — URINALYSIS COMPLETE WITH MICROSCOPIC (ARMC ONLY)
BACTERIA UA: NONE SEEN
BILIRUBIN URINE: NEGATIVE
GLUCOSE, UA: NEGATIVE mg/dL
KETONES UR: NEGATIVE mg/dL
NITRITE: NEGATIVE
Protein, ur: NEGATIVE mg/dL
SPECIFIC GRAVITY, URINE: 1.005 (ref 1.005–1.030)
Squamous Epithelial / LPF: NONE SEEN
pH: 9 — ABNORMAL HIGH (ref 5.0–8.0)

## 2016-03-22 LAB — CBC WITH DIFFERENTIAL/PLATELET
Basophils Absolute: 0 10*3/uL (ref 0–0.1)
Basophils Relative: 1 %
Eosinophils Absolute: 0.2 10*3/uL (ref 0–0.7)
Eosinophils Relative: 3 %
HEMATOCRIT: 34.5 % — AB (ref 35.0–47.0)
Hemoglobin: 11.9 g/dL — ABNORMAL LOW (ref 12.0–16.0)
Lymphs Abs: 1 10*3/uL (ref 1.0–3.6)
MCH: 32 pg (ref 26.0–34.0)
MCHC: 34.4 g/dL (ref 32.0–36.0)
MCV: 92.9 fL (ref 80.0–100.0)
MONO ABS: 0.6 10*3/uL (ref 0.2–0.9)
NEUTROS ABS: 5.2 10*3/uL (ref 1.4–6.5)
Neutrophils Relative %: 73 %
PLATELETS: 166 10*3/uL (ref 150–440)
RBC: 3.71 MIL/uL — ABNORMAL LOW (ref 3.80–5.20)
RDW: 14.9 % — ABNORMAL HIGH (ref 11.5–14.5)
WBC: 7.1 10*3/uL (ref 3.6–11.0)

## 2016-03-22 LAB — BASIC METABOLIC PANEL
ANION GAP: 6 (ref 5–15)
BUN: 12 mg/dL (ref 6–20)
CALCIUM: 8.9 mg/dL (ref 8.9–10.3)
CO2: 31 mmol/L (ref 22–32)
CREATININE: 0.63 mg/dL (ref 0.44–1.00)
Chloride: 85 mmol/L — ABNORMAL LOW (ref 101–111)
GFR calc non Af Amer: >60 mL/min (ref >60)
Glucose, Bld: 79 mg/dL (ref 65–99)
Potassium: 4.3 mmol/L (ref 3.5–5.1)
SODIUM: 122 mmol/L — AB (ref 135–145)

## 2016-03-22 LAB — PROTIME-INR
INR: 1.95
PROTHROMBIN TIME: 22.1 s — AB (ref 11.4–15.0)

## 2016-03-24 LAB — URINE CULTURE: Culture: >=100000 — AB

## 2016-04-26 IMAGING — DX DG CHEST 2V
2 series · 2 of 2 positions shown · non-contrast
Comparison: 04/27/2014

CLINICAL DATA: Shortness of breath beginning last week and
progressively worse this week, history CHF, atrial fibrillation,
pacemaker, hyperlipidemia

EXAM:
CHEST  2 VIEW

[chest pa]
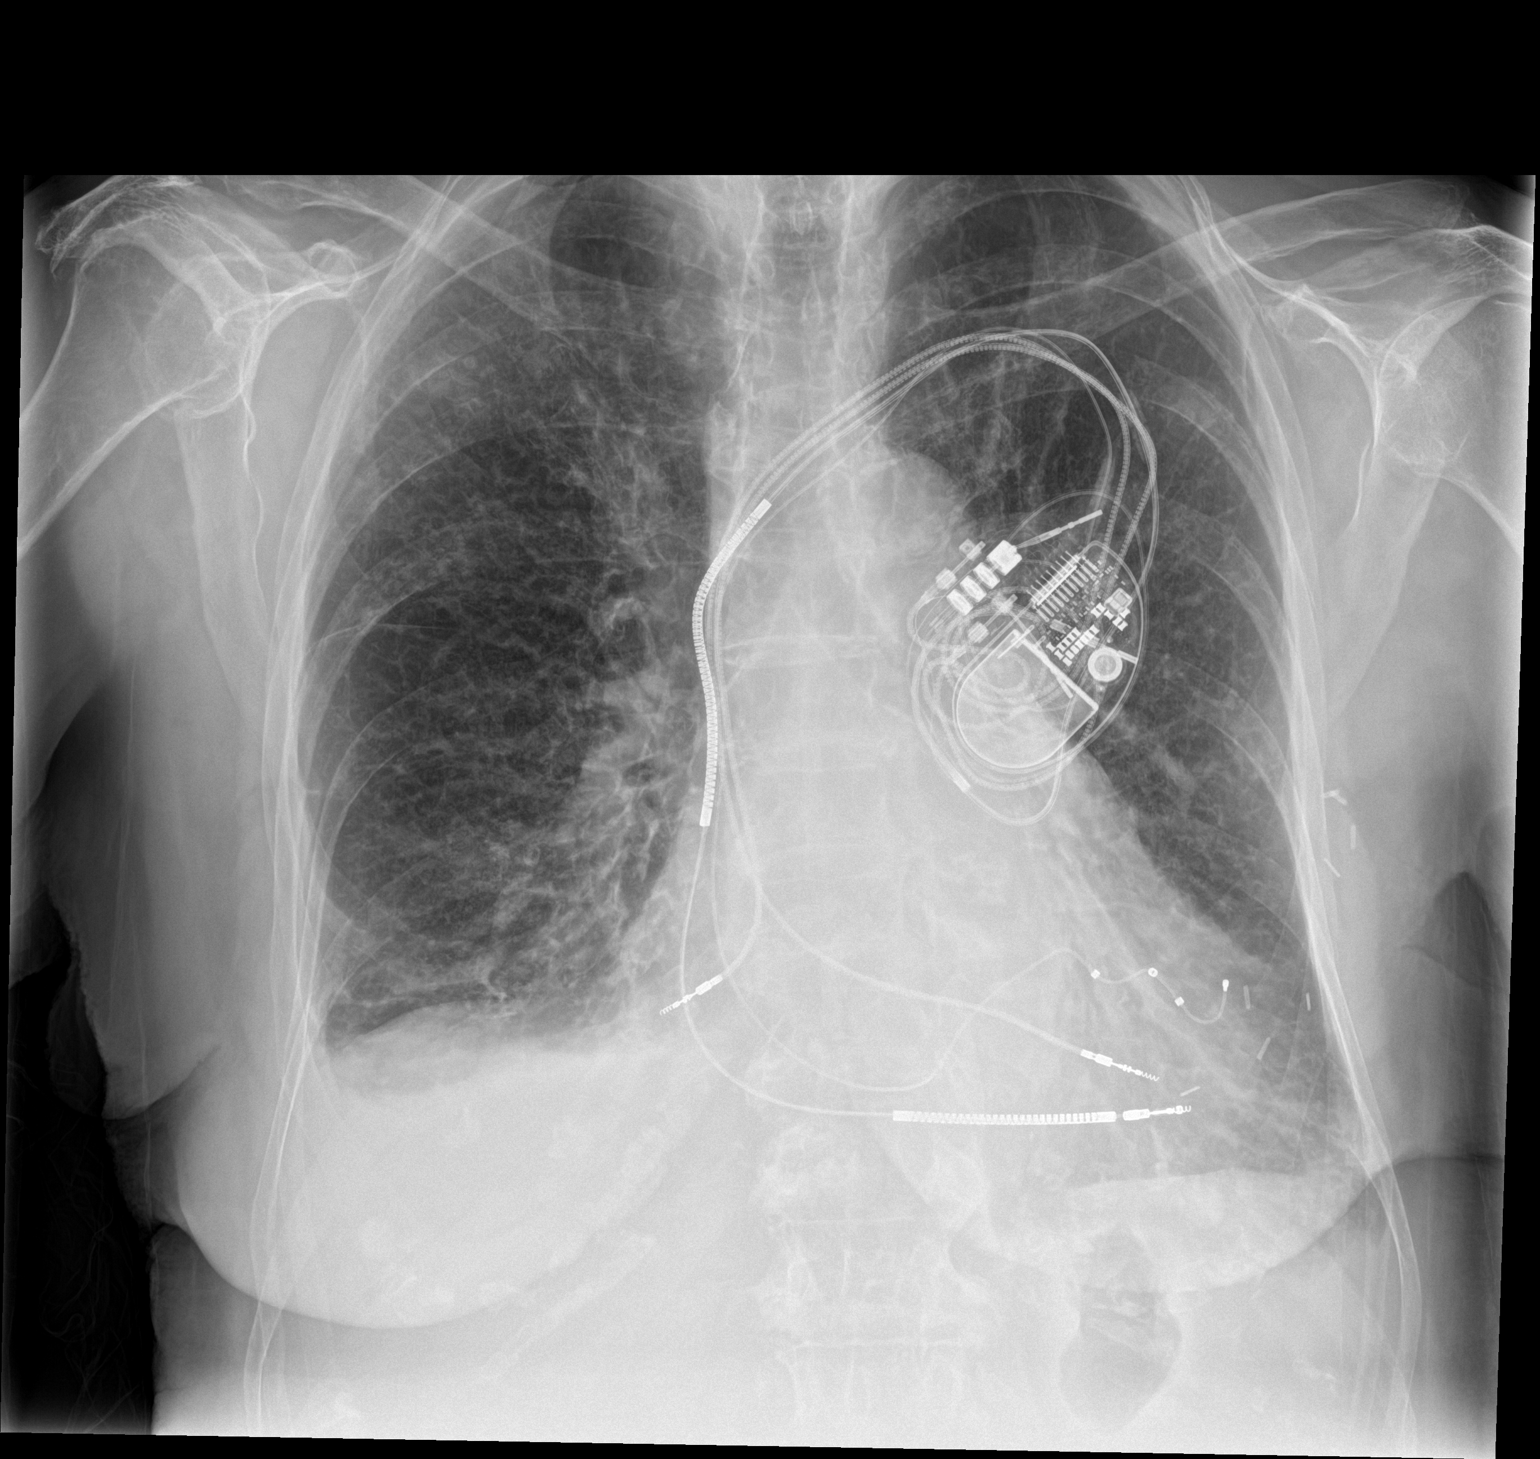

[chest lat]
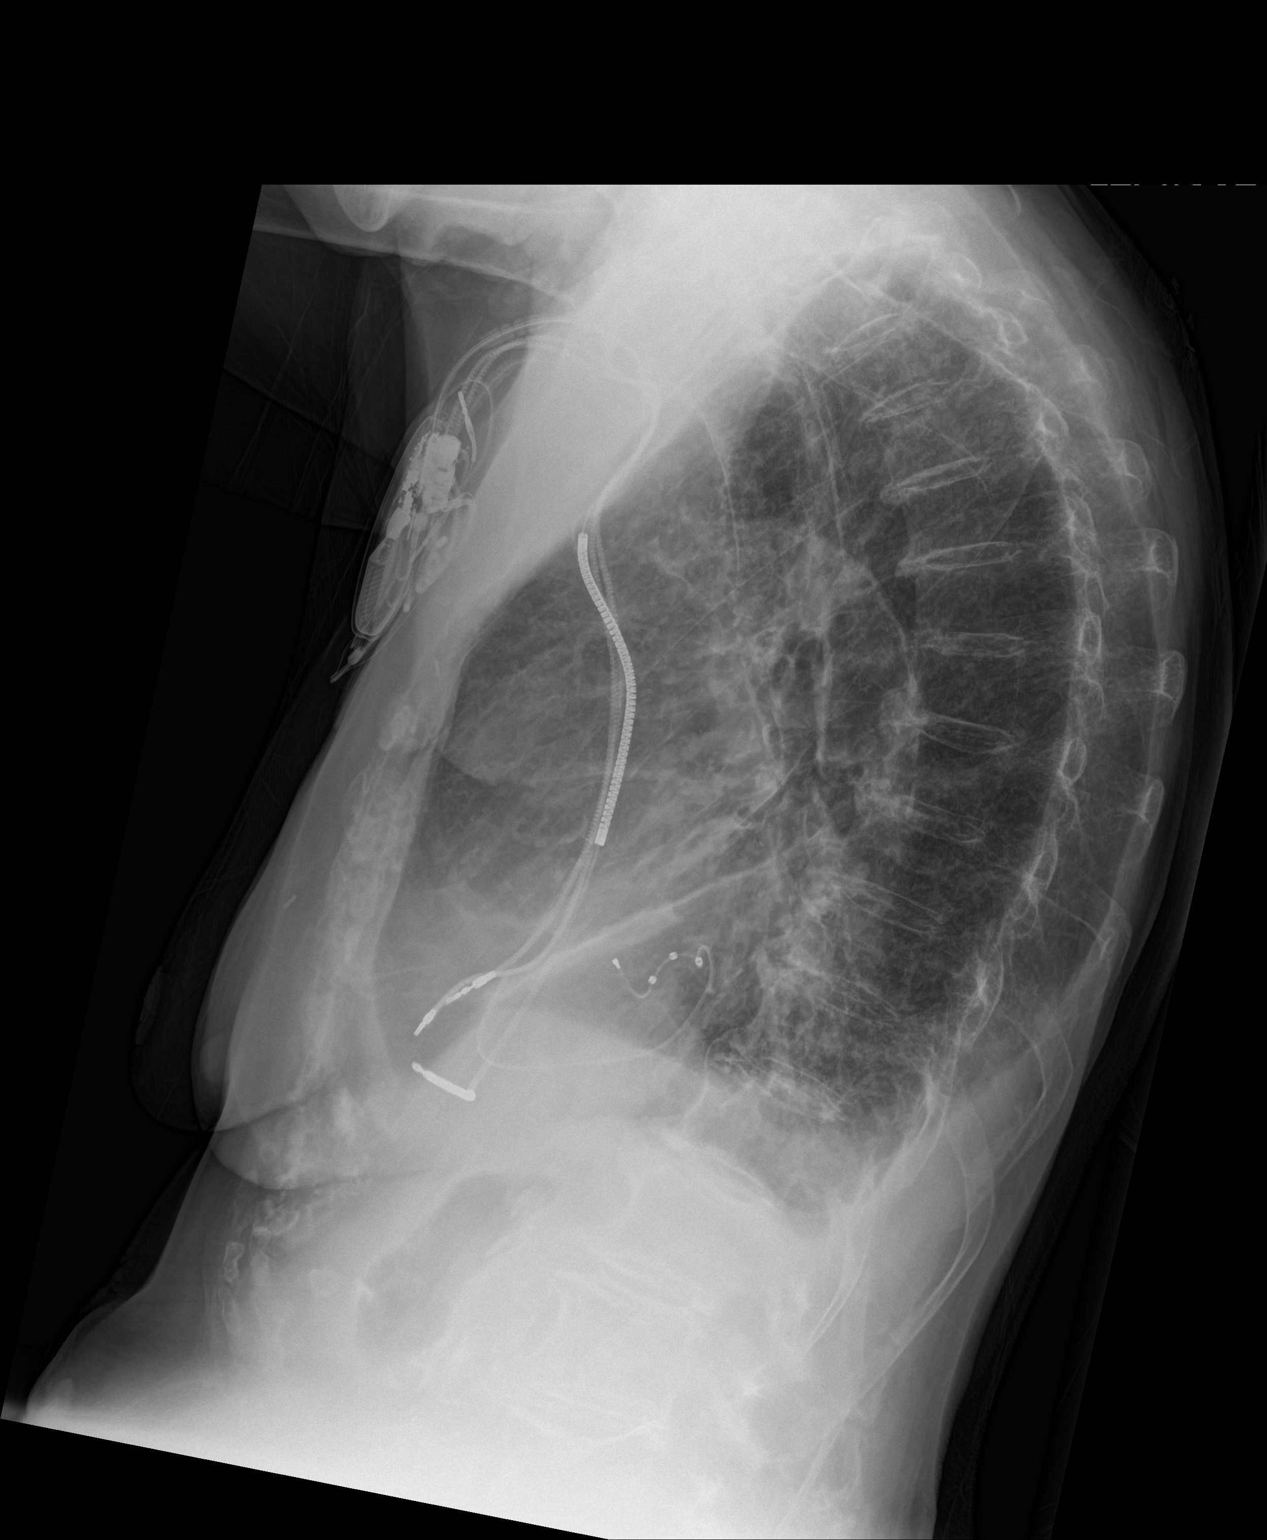

[2 of 2 positions shown; findings below may reference images not displayed]

FINDINGS: LEFT subclavian transvenous pacemaker/AICD leads project at RIGHT
atrium, RIGHT ventricle and coronary sinus.

Enlargement of cardiac silhouette.

Mediastinal contours and pulmonary vascularity normal.

Calcified tortuous thoracic aorta.

Emphysematous and bronchitic changes consistent with COPD.

Diffuse increase in interstitial markings since the previous exam
greatest in RIGHT upper lobe, question asymmetric edema versus less
likely infection.

Small bibasilar effusions larger on RIGHT.

No pneumothorax.

Bones demineralized.
IMPRESSION: COPD changes with increased interstitial markings/infiltrate since
previous exam, greatest in RIGHT upper lobe, question asymmetric
edema or less likely pneumonia.

Small bibasilar effusions and minimal atelectasis greater on RIGHT.

Post pacemaker/AICD placement.

## 2016-07-08 DEATH — deceased

## 2016-08-07 DEATH — deceased

## 2017-03-15 IMAGING — CR DG LUMBAR SPINE 2-3V
3 series · 3 of 3 positions shown · non-contrast
Comparison: Abdominal CT 03/21/2013

CLINICAL DATA: Fall with lower back pain. Initial encounter.

EXAM:
LUMBAR SPINE - 2-3 VIEW

[l-spine ap]
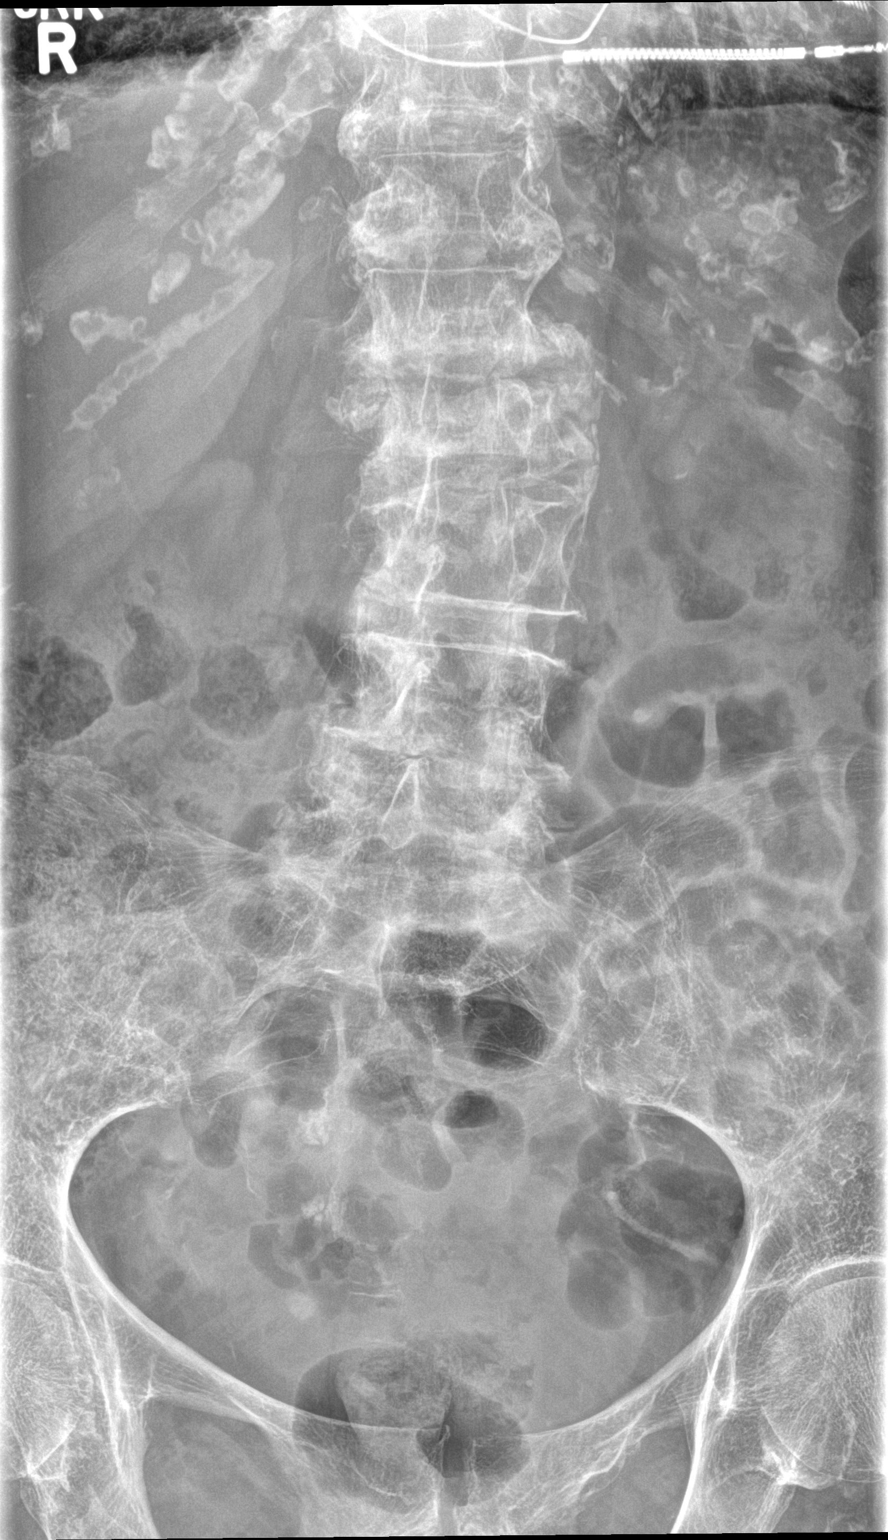

[l-spine lat]
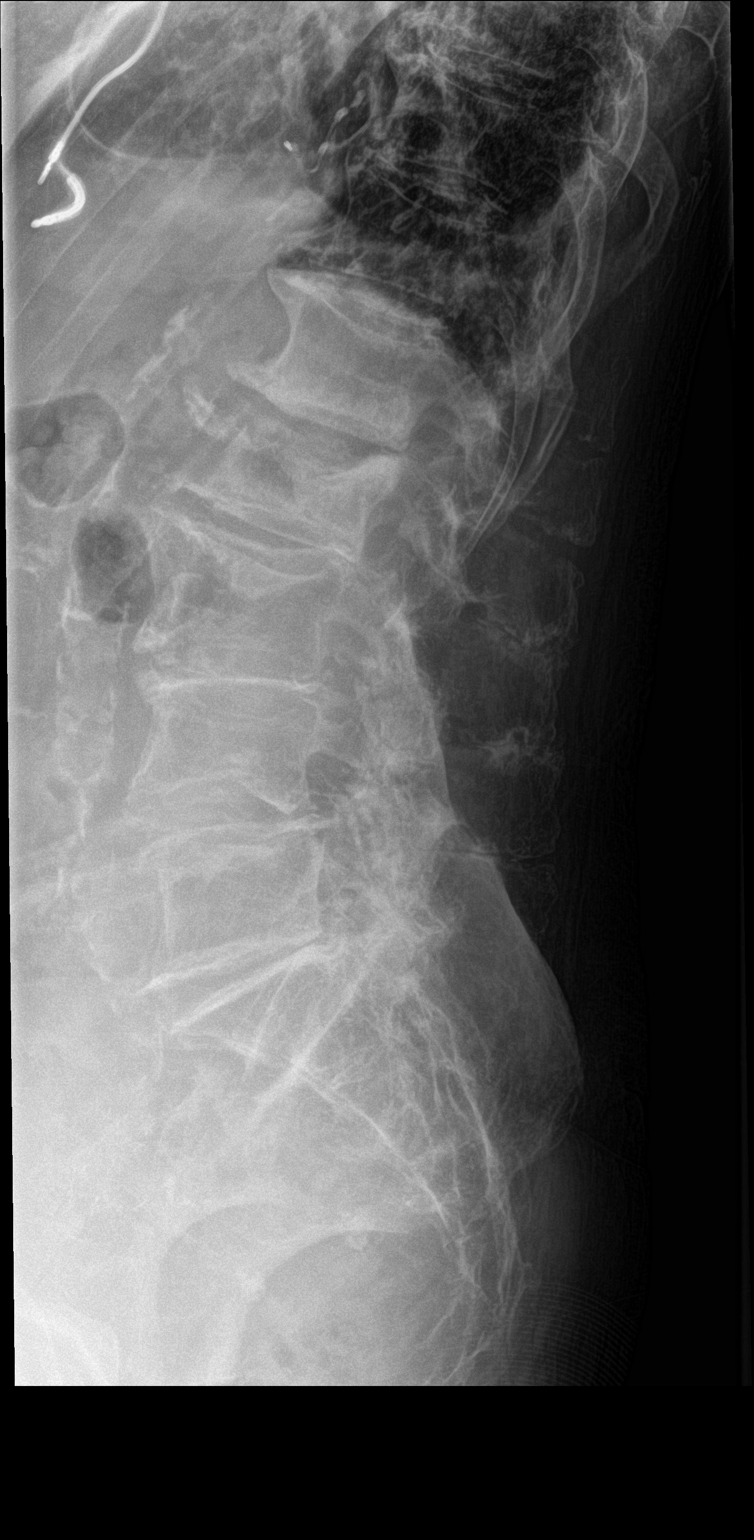

[l-spine spot]
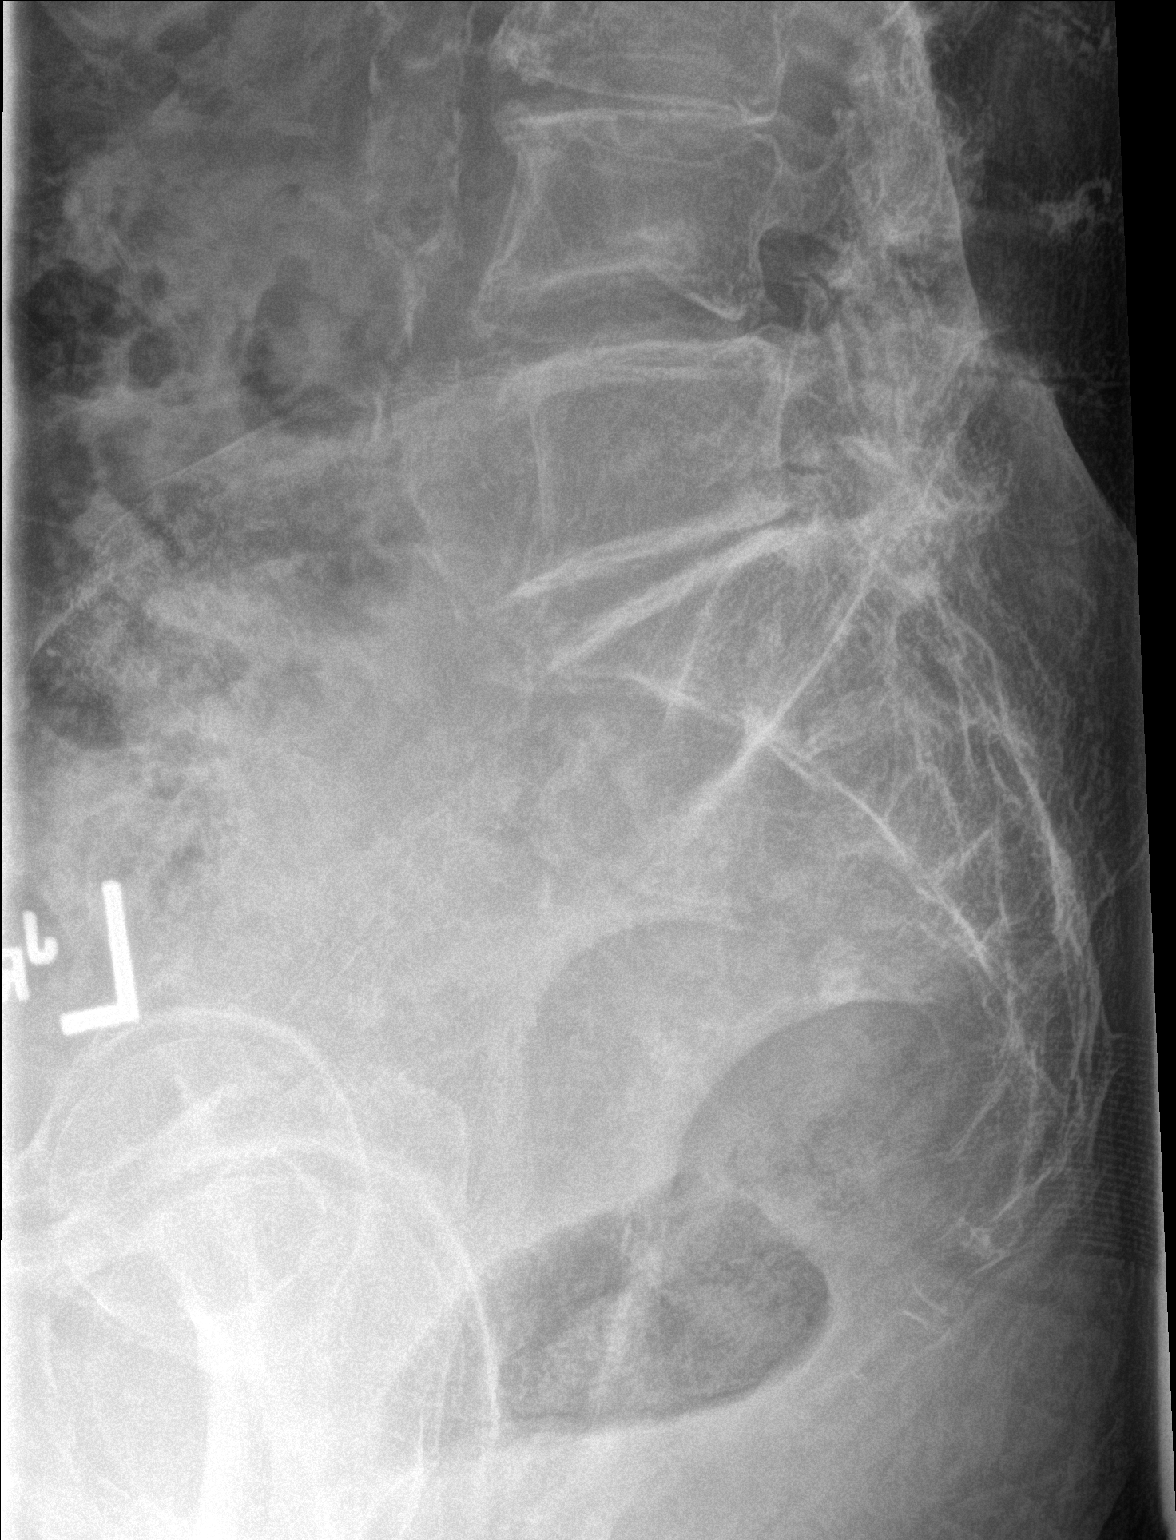

[3 of 3 positions shown; findings below may reference images not displayed]

FINDINGS: L2 body fracture with superior endplate compression causing 
50%
height loss at maximum. Fracture age is indeterminate. No
retropulsion or subluxation.

L3 superior endplate and L4 inferior endplate compression fractures
are chronic and stable from prior. No evidence of traumatic
malalignment.

Chronic L4-5 mild anterolisthesis associated with facet arthropathy.
Advanced L5-S1 degenerative disc narrowing. Upper lumbar spondylotic
endplate spurring.

Osteopenia.
IMPRESSION: 1. Age-indeterminate L2 compression fracture with 
50% height
loss. Negative for retropulsion.
2. Remote L3 and L4 compression fractures.
